# Patient Record
Sex: Female | Born: 1950 | Race: White | Hispanic: No | Marital: Married | Smoking: Never smoker
Health system: Southern US, Community
[De-identification: ages and names within clinical notes are randomized; demographics above are authoritative.]

## PROBLEM LIST (undated history)

## (undated) ENCOUNTER — Emergency Department: Discharge status: Absent without leave | Payer: Commercial Managed Care - PPO

## (undated) DIAGNOSIS — I10 Essential (primary) hypertension: Secondary | ICD-10-CM

## (undated) DIAGNOSIS — E119 Type 2 diabetes mellitus without complications: Secondary | ICD-10-CM

## (undated) HISTORY — PX: TONSILLECTOMY: SUR1361

---

## 2008-06-10 ENCOUNTER — Ambulatory Visit: Payer: Self-pay | Admitting: Family Medicine

## 2013-10-14 ENCOUNTER — Ambulatory Visit: Payer: Self-pay | Admitting: Unknown Physician Specialty

## 2013-10-26 ENCOUNTER — Emergency Department: Payer: Self-pay | Admitting: Emergency Medicine

## 2013-10-26 LAB — CBC WITH DIFFERENTIAL/PLATELET
Basophil #: 0.1 10*3/uL (ref 0.0–0.1)
Basophil %: 0.6 %
Eosinophil #: 0.1 10*3/uL (ref 0.0–0.7)
Eosinophil %: 1 %
HCT: 38.1 % (ref 35.0–47.0)
HGB: 12.7 g/dL (ref 12.0–16.0)
LYMPHS ABS: 0.8 10*3/uL — AB (ref 1.0–3.6)
Lymphocyte %: 8.5 %
MCH: 28.8 pg (ref 26.0–34.0)
MCHC: 33.3 g/dL (ref 32.0–36.0)
MCV: 87 fL (ref 80–100)
MONO ABS: 1 x10 3/mm — AB (ref 0.2–0.9)
Monocyte %: 10.8 %
NEUTROS ABS: 7.7 10*3/uL — AB (ref 1.4–6.5)
Neutrophil %: 79.1 %
PLATELETS: 185 10*3/uL (ref 150–440)
RBC: 4.4 10*6/uL (ref 3.80–5.20)
RDW: 14.2 % (ref 11.5–14.5)
WBC: 9.7 10*3/uL (ref 3.6–11.0)

## 2013-10-26 LAB — COMPREHENSIVE METABOLIC PANEL
ALK PHOS: 80 U/L
ANION GAP: 5 — AB (ref 7–16)
Albumin: 3.8 g/dL (ref 3.4–5.0)
BUN: 9 mg/dL (ref 7–18)
Bilirubin,Total: 0.3 mg/dL (ref 0.2–1.0)
CALCIUM: 9.2 mg/dL (ref 8.5–10.1)
CHLORIDE: 102 mmol/L (ref 98–107)
Co2: 26 mmol/L (ref 21–32)
Creatinine: 0.84 mg/dL (ref 0.60–1.30)
EGFR (African American): >60
EGFR (Non-African Amer.): >60
GLUCOSE: 203 mg/dL — AB (ref 65–99)
OSMOLALITY: 271 (ref 275–301)
POTASSIUM: 3.7 mmol/L (ref 3.5–5.1)
SGOT(AST): 38 U/L — ABNORMAL HIGH (ref 15–37)
SGPT (ALT): 42 U/L (ref 12–78)
SODIUM: 133 mmol/L — AB (ref 136–145)
TOTAL PROTEIN: 7.8 g/dL (ref 6.4–8.2)

## 2013-10-26 LAB — URINALYSIS, COMPLETE
BLOOD: NEGATIVE
Bilirubin,UR: NEGATIVE
Glucose,UR: NEGATIVE mg/dL (ref 0–75)
LEUKOCYTE ESTERASE: NEGATIVE
NITRITE: NEGATIVE
PH: 5 (ref 4.5–8.0)
Protein: NEGATIVE
RBC,UR: 3 /HPF (ref 0–5)
Specific Gravity: 1.03 (ref 1.003–1.030)
WBC UR: 6 /HPF (ref 0–5)

## 2013-10-26 LAB — RAPID INFLUENZA A&B ANTIGENS

## 2013-10-28 LAB — URINE CULTURE

## 2013-10-31 LAB — CULTURE, BLOOD (SINGLE)

## 2017-07-15 ENCOUNTER — Encounter: Payer: Self-pay | Admitting: Emergency Medicine

## 2017-07-15 ENCOUNTER — Emergency Department
Admission: EM | Admit: 2017-07-15 | Discharge: 2017-07-16 | Disposition: A | Discharge status: Other Acute Inpatient Hospital | Payer: Medicare Other | Attending: Emergency Medicine | Admitting: Emergency Medicine

## 2017-07-15 DIAGNOSIS — F329 Major depressive disorder, single episode, unspecified: Secondary | ICD-10-CM | POA: Insufficient documentation

## 2017-07-15 DIAGNOSIS — F419 Anxiety disorder, unspecified: Secondary | ICD-10-CM | POA: Diagnosis not present

## 2017-07-15 DIAGNOSIS — E119 Type 2 diabetes mellitus without complications: Secondary | ICD-10-CM | POA: Insufficient documentation

## 2017-07-15 DIAGNOSIS — R45851 Suicidal ideations: Secondary | ICD-10-CM | POA: Diagnosis not present

## 2017-07-15 DIAGNOSIS — F22 Delusional disorders: Secondary | ICD-10-CM | POA: Insufficient documentation

## 2017-07-15 LAB — COMPREHENSIVE METABOLIC PANEL
ALT: 17 U/L (ref 14–54)
AST: 19 U/L (ref 15–41)
Albumin: 3.9 g/dL (ref 3.5–5.0)
Alkaline Phosphatase: 64 U/L (ref 38–126)
Anion gap: 9 (ref 5–15)
BUN: 12 mg/dL (ref 6–20)
CO2: 22 mmol/L (ref 22–32)
Calcium: 9.5 mg/dL (ref 8.9–10.3)
Chloride: 107 mmol/L (ref 101–111)
Creatinine, Ser: 0.79 mg/dL (ref 0.44–1.00)
GFR calc Af Amer: >60 mL/min (ref >60)
GFR calc non Af Amer: >60 mL/min (ref >60)
Glucose, Bld: 258 mg/dL — ABNORMAL HIGH (ref 65–99)
Potassium: 3.5 mmol/L (ref 3.5–5.1)
Sodium: 138 mmol/L (ref 135–145)
Total Bilirubin: 0.6 mg/dL (ref 0.3–1.2)
Total Protein: 7.3 g/dL (ref 6.5–8.1)

## 2017-07-15 LAB — URINE DRUG SCREEN, QUALITATIVE (ARMC ONLY)
AMPHETAMINES, UR SCREEN: NOT DETECTED
BARBITURATES, UR SCREEN: NOT DETECTED
BENZODIAZEPINE, UR SCRN: NOT DETECTED
Cannabinoid 50 Ng, Ur ~~LOC~~: NOT DETECTED
Cocaine Metabolite,Ur ~~LOC~~: NOT DETECTED
MDMA (Ecstasy)Ur Screen: NOT DETECTED
Methadone Scn, Ur: NOT DETECTED
OPIATE, UR SCREEN: NOT DETECTED
PHENCYCLIDINE (PCP) UR S: NOT DETECTED
Tricyclic, Ur Screen: NOT DETECTED

## 2017-07-15 LAB — CBC
HCT: 37.4 % (ref 35.0–47.0)
Hemoglobin: 12.8 g/dL (ref 12.0–16.0)
MCH: 29.4 pg (ref 26.0–34.0)
MCHC: 34.3 g/dL (ref 32.0–36.0)
MCV: 85.7 fL (ref 80.0–100.0)
PLATELETS: 240 10*3/uL (ref 150–440)
RBC: 4.37 MIL/uL (ref 3.80–5.20)
RDW: 13.6 % (ref 11.5–14.5)
WBC: 8.1 10*3/uL (ref 3.6–11.0)

## 2017-07-15 LAB — GLUCOSE, CAPILLARY
GLUCOSE-CAPILLARY: 183 mg/dL — AB (ref 65–99)
GLUCOSE-CAPILLARY: 164 mg/dL — AB (ref 65–99)
GLUCOSE-CAPILLARY: 189 mg/dL — AB (ref 65–99)
GLUCOSE-CAPILLARY: 308 mg/dL — AB (ref 65–99)

## 2017-07-15 LAB — ACETAMINOPHEN LEVEL

## 2017-07-15 LAB — SALICYLATE LEVEL: Salicylate Lvl: <7 mg/dL (ref 2.8–30.0)

## 2017-07-15 LAB — ETHANOL: Alcohol, Ethyl (B): <5 mg/dL (ref <5)

## 2017-07-15 MED ORDER — LISINOPRIL 5 MG PO TABS
30.0000 mg | ORAL_TABLET | Freq: Every day | ORAL | Status: DC
  Administered 2017-07-15 – 2017-07-16 (×2): 30 mg via ORAL
  Filled 2017-07-15: qty 2
  Filled 2017-07-15: qty 3

## 2017-07-15 MED ORDER — INSULIN ASPART 100 UNIT/ML ~~LOC~~ SOLN
0.0000 [IU] | Freq: Three times a day (TID) | SUBCUTANEOUS | Status: DC
  Administered 2017-07-15 – 2017-07-16 (×4): 2 [IU] via SUBCUTANEOUS
  Filled 2017-07-15 (×3): qty 1

## 2017-07-15 MED ORDER — ASPIRIN EC 81 MG PO TBEC
81.0000 mg | DELAYED_RELEASE_TABLET | Freq: Every day | ORAL | Status: DC
  Administered 2017-07-15 – 2017-07-16 (×2): 81 mg via ORAL
  Filled 2017-07-15 (×2): qty 1

## 2017-07-15 MED ORDER — PANTOPRAZOLE SODIUM 40 MG PO TBEC
40.0000 mg | DELAYED_RELEASE_TABLET | Freq: Every day | ORAL | Status: DC
  Administered 2017-07-15 – 2017-07-16 (×2): 40 mg via ORAL
  Filled 2017-07-15 (×2): qty 1

## 2017-07-15 MED ORDER — INSULIN DETEMIR 100 UNIT/ML ~~LOC~~ SOLN
50.0000 [IU] | Freq: Every day | SUBCUTANEOUS | Status: DC
  Administered 2017-07-15: 50 [IU] via SUBCUTANEOUS
  Filled 2017-07-15 (×3): qty 0.5

## 2017-07-15 MED ORDER — INSULIN ASPART 100 UNIT/ML ~~LOC~~ SOLN
0.0000 [IU] | Freq: Every day | SUBCUTANEOUS | Status: DC
  Administered 2017-07-15: 4 [IU] via SUBCUTANEOUS
  Filled 2017-07-15: qty 1

## 2017-07-15 MED ORDER — OLANZAPINE 5 MG PO TBDP
2.5000 mg | ORAL_TABLET | Freq: Every day | ORAL | Status: DC
  Administered 2017-07-15: 2.5 mg via ORAL
  Filled 2017-07-15 (×2): qty 0.5

## 2017-07-15 MED ORDER — OLANZAPINE 5 MG PO TBDP
2.5000 mg | ORAL_TABLET | Freq: Once | ORAL | Status: AC
Start: 2017-07-15 — End: 2017-07-15
  Administered 2017-07-15: 2.5 mg via ORAL
  Filled 2017-07-15: qty 1

## 2017-07-15 MED ORDER — FLUOXETINE HCL 20 MG PO CAPS
20.0000 mg | ORAL_CAPSULE | Freq: Every day | ORAL | Status: DC
  Administered 2017-07-15: 20 mg via ORAL
  Filled 2017-07-15: qty 1

## 2017-07-15 MED ORDER — LORAZEPAM 1 MG PO TABS: 1.0000 mg | ORAL_TABLET | ORAL | Status: DC | PRN

## 2017-07-15 MED ORDER — FLUOXETINE HCL 20 MG PO CAPS
20.0000 mg | ORAL_CAPSULE | Freq: Once | ORAL | Status: AC
  Administered 2017-07-15: 20 mg via ORAL
  Filled 2017-07-15: qty 1

## 2017-07-15 MED ORDER — SIMVASTATIN 40 MG PO TABS
20.0000 mg | ORAL_TABLET | Freq: Every day | ORAL | Status: DC
  Administered 2017-07-15 – 2017-07-16 (×2): 20 mg via ORAL
  Filled 2017-07-15: qty 1
  Filled 2017-07-15: qty 2

## 2017-07-15 NOTE — ED Provider Notes (Signed)
[facility] Emergency Department Provider Note  ____________________________________________   First MD Initiated Contact with Patient 07/15/17 [phone]     (approximate)  I have reviewed the triage vital signs and the nursing notes.   HISTORY  Chief Complaint Allergic Reaction and Suicidal   HPI Miranda Delgado is a 66 y.o. female who is presenting with a possible adverse reaction to citalopram.  she says that she started this medication earlier today and then woke up with redness to her chest and feeling shortness of breath. Says that the symptoms have almost complete abated at this time and she is feeling very anxious and is having suicidal thoughts. She has no specific plan.    No past medical history on file.  There are no active problems to display for this patient.   No past surgical history on file.  Prior to Admission medications   Not on File    Allergies Patient has no known allergies.  No family history on file.  Social History Social History  Substance Use Topics  . Smoking status: Not on file  . Smokeless tobacco: Not on file  . Alcohol use Not on file    Review of Systems  Constitutional: No fever/chills Eyes: No visual changes. ENT: No sore throat. Cardiovascular: Denies chest pain. Respiratory: Denies shortness of breath. Gastrointestinal: No abdominal pain.  No nausea, no vomiting.  No diarrhea.  No constipation. Genitourinary: Negative for dysuria. Musculoskeletal: Negative for back pain. Skin: Negative for rash. Neurological: Negative for headaches, focal weakness or numbness.   ____________________________________________   PHYSICAL EXAM:  VITAL SIGNS: ED Triage Vitals  Enc Vitals Group     BP 07/15/17 0303 (!) 174/65     Pulse Rate 07/15/17 0303 80     Resp 07/15/17 0303 18     Temp 07/15/17 0303 97.8 F (36.6 C)     Temp Source 07/15/17 0303 Oral     SpO2 07/15/17 0303 98 %     Weight 07/15/17  0307 157 lb (71.2 kg)     Height 07/15/17 0307 5\' 3"  (1.6 m)     Head Circumference --      Peak Flow --      Pain Score --      Pain Loc --      Pain Edu? --      Excl. in [place]? --     Constitutional: Alert and oriented. Well appearing and in no acute distress. Eyes: Conjunctivae are normal.  Head: Atraumatic. Nose: No congestion/rhinnorhea. Mouth/Throat: Mucous membranes are moist.  Neck: No stridor.   Cardiovascular: Normal rate, regular rhythm. Grossly normal heart sounds.   Respiratory: Normal respiratory effort.  No retractions. Lungs CTAB. Gastrointestinal: Soft and nontender. No distention.  Musculoskeletal: No lower extremity tenderness nor edema.  No joint effusions. Neurologic:  Normal speech and language. No gross focal neurologic deficits are appreciated. Skin:  Skin is warm, dry and intact. No rash noted. Psychiatric: Mood and affect are normal. Speech and behavior are normal.  ____________________________________________   LABS (all labs ordered are listed, but only abnormal results are displayed)  Labs Reviewed  COMPREHENSIVE METABOLIC PANEL - Abnormal; Notable for the following:       Result Value   Glucose, Bld 258 (*)    All other components within normal limits  ACETAMINOPHEN LEVEL - Abnormal; Notable for the following:    Acetaminophen (Tylenol), Serum <10 (*)    All other components within normal limits  ETHANOL  SALICYLATE  LEVEL  CBC  URINE DRUG SCREEN, QUALITATIVE ([facility] ONLY)   ____________________________________________  EKG   ____________________________________________  RADIOLOGY   ____________________________________________   PROCEDURES  Procedure(s) performed:   Procedures  Critical Care performed:   ____________________________________________   INITIAL IMPRESSION / ASSESSMENT AND PLAN / ED COURSE  Pertinent labs & imaging results that were available during my care of the patient were reviewed by me and considered in my  medical decision making (see chart for details).  Patient initially laughed after she said that she was considering killing himself. However, she said "I'm serious." She will be placed under involuntary commitment. Psychiatry to see.      ____________________________________________   FINAL CLINICAL IMPRESSION(S) / ED DIAGNOSES  suicidal ideation.    NEW MEDICATIONS STARTED DURING THIS VISIT:  New Prescriptions   No medications on file     Note:  This document was prepared using Dragon voice recognition software and may include unintentional dictation errors.     Orbie Pyo, MD 07/15/17 0600

## 2017-07-15 NOTE — ED Notes (Addendum)
Referral information for Psychiatric Hospitalization faxed to;     [place] [phone]),    [address] [phone]),    [facility] [phone])   [facility] [phone]),    [place] [phone] or [phone]),    [facility] [phone]),    [place] [phone]   [facility] [phone]).

## 2017-07-15 NOTE — ED Notes (Signed)
Pt in room, took meds without difficulty, explained new medications from psychiatrist. Pt agreeable to take new meds.  Discussed some of the side effects as well. Informed patient that she will be moved to [facility] this morning.

## 2017-07-15 NOTE — ED Notes (Signed)
Pt ate dinner and has been watching some television. She has been calm/cooperative. Dr. Weber Cooks spoke with pt prior to dinner. Pt remains under IVC for placement. Will continue to monitor for needs/safety.
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
## 2017-07-15 NOTE — ED Notes (Signed)
IVC/SOC REC INPATIENT
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
## 2017-07-15 NOTE — ED Triage Notes (Signed)
Pt brought to triage via wheelchair. Pt reports she woke up this morning with a feeling of warmth going over her body. Pt reports she started a new medication today for depression. (escitalopram 10 mg). Pt deneis n/v, shortness of breath, no swelling or problems with breathing. Pt reports she feels like she is crazy. When asked if she was having any thoughts of harming herself on purpose pt states she has thoughts of not wanting to live.

## 2017-07-15 NOTE — ED Notes (Signed)
IVC 

## 2017-07-15 NOTE — ED Notes (Signed)
Report was received from Lydia Guiles., RN; Pt. Verbalizes  complaints of having paranoia; delusional; distress with stating that, she is allergic to celexa;  Verbalizes having S.I.; denies  Having Hi. Continue to monitor with 15 min. Monitoring.

## 2017-07-15 NOTE — ED Notes (Signed)
Pt family requested to be contacted by psychiatry.

## 2017-07-15 NOTE — BH Assessment (Signed)
Patient is being reviewed by [facility] for inpatient admission.

## 2017-07-15 NOTE — ED Provider Notes (Signed)
-----------------------------------------   9:13 AM on 07/15/2017 -----------------------------------------   Blood pressure (!) 174/65, pulse 80, temperature 97.8 F (36.6 C), temperature source Oral, resp. rate 18, height 5\' 3"  (1.6 m), weight 71.2 kg (157 lb), SpO2 98 %.  patient evaluated by Dr. Christophe Louis, psychiatrist and recommended inpatient admission. He recommended starting patient on Zyprexa, fluoxetine, and PRN Ativan and DC Lexapro. These medications have been ordered.   Alfred Levins, [place], MD 07/15/17 [phone]

## 2017-07-15 NOTE — ED Notes (Signed)
Patient denies any SI/HI, pt denies any hallucinations, auditory or visual.  Pt is alert and oriented. Pt states she has been up to the bathroom this morning. Pt does not want to eat breakfast at this time. Informed her that a tray had been ordered for her.  Pt has hx of DM and does not have any medications ordered for DM at this time.  Dr. Alfred Levins to reconcile medications once recommendations received from [facility].

## 2017-07-15 NOTE — ED Notes (Signed)
Pt has been polite and cooperative. She denies SI, HI, and AVH. She ate some of lunch and expressed a desire to rest.

## 2017-07-15 NOTE — ED Notes (Signed)

## 2017-07-15 NOTE — ED Notes (Signed)
Pt dressed out by Eliezer Lofts, RN who reports pt's jewelry was given to her husband to take home. Pt had bra, shirt, panties, pants and a pair of sandals that were placed in belongings bag and taken to [facility] locker room.

## 2017-07-16 LAB — GLUCOSE, CAPILLARY
GLUCOSE-CAPILLARY: 183 mg/dL — AB (ref 65–99)
GLUCOSE-CAPILLARY: 182 mg/dL — AB (ref 65–99)
Glucose-Capillary: 177 mg/dL — ABNORMAL HIGH (ref 65–99)

## 2017-07-16 NOTE — ED Notes (Signed)
Report given to Wille Glaser, Therapist, sports at [employer]

## 2017-07-16 NOTE — ED Notes (Addendum)
Brought breakfast to patient. Pt reports 'feeling better' but anxious about paranoid thoughts. Pt worried that she will continue to have thoughts when she goes home. Pt currently denies SI/HI and reports that she is less depressed.

## 2017-07-16 NOTE — BH Assessment (Signed)
Pt's IVC paperwork was faxed to [facility], as requested.

## 2017-07-16 NOTE — ED Notes (Addendum)
Patient went to bathroom then came to nurses station door and knocked. This Probation officer went to see what patient needed. Patient looked pale and confused, and started to sink to the floor.  This Probation officer went behind patient and assisted to floor and called for fellow RN for assistant.  Vitals obtained, CBG obtained. Charge ER RN called by fellow RN.

## 2017-07-16 NOTE — ED Notes (Signed)
Patient assisted to bed. Advised patient to use call light above head of bed if she needs to go to the restroom.

## 2017-07-16 NOTE — ED Notes (Signed)
Patient visiting with husband. 

## 2017-07-16 NOTE — ED Provider Notes (Signed)
-----------------------------------------   7:16 AM on 07/16/2017 -----------------------------------------   Blood pressure (!) 112/46, pulse 69, temperature 98.2 F (36.8 C), temperature source Oral, resp. rate 16, height 5\' 3"  (1.6 m), weight 71.2 kg (157 lb), SpO2 98 %.  The patient had no acute events since last update.  Calm and cooperative at this time.  Disposition is pending Psychiatry/Behavioral Medicine team recommendations.     Schuyler Amor, MD 07/16/17 [phone]

## 2017-07-16 NOTE — ED Notes (Signed)
Called to [facility] for patient having had a syncopal event. Patient is awake and able to answer questions; states she got up to use the bathroom thinking she may have to have a BM and "got real cold and real nauseated." Tapped on the window to let staff know she didn't feel well and the "next thing I know I'm down here." [place] staff states the patient did not hit the floor but rather slid the patient down her leg to the floor. MD notified and came to assess patient. No new orders received. Patient's color is pink, skin warm and dry after sitting up in the wheelchair. Will give po fluids and patient encouraged to let the staff know if she has same or similar symptoms return.

## 2017-07-16 NOTE — ED Notes (Addendum)
Patient made aware of upcoming transfer to [place] is agreeable .

## 2017-07-16 NOTE — BH Assessment (Signed)
Sherri Rad called to state pt will be accepted at [facility]. She can be reached at [phone]. She requested a copy of the IVC be faxed to her at [phone].  Physician: Dr. Hiram Gash Room: [place] Bed 2 Phone: [phone]

## 2017-07-16 NOTE — ED Notes (Signed)
Patient visiting with daughter 

## 2017-07-16 NOTE — ED Notes (Signed)
Patient transferred to [facility], under IVC, via female sheriff with all paperwork, no belongings except clothes pt is wearing.  Pt currently denies SI/HI and A/V hallucinations. Pt is stable and appreciative at time of departure.  Pt given opportunity to express concerns and ask questions.

## 2017-07-16 NOTE — ED Notes (Signed)
Lunch brought to patient 

## 2017-07-16 NOTE — Clinical Social Work Note (Signed)
CSW received phone call from [facility] [phone], they will accept patient.  CSW gave [facility] intake worker the ED phone number to speak to bedside nurse.  Trabert Broom. [place], MSW, [place]  07/16/2017 10:22 AM

## 2017-07-16 NOTE — ED Notes (Signed)
Patient awakened; up to the bathroom; came out of the bathroom knocked on the nurses station door; RN went to the door; noticed patient slumping to the floor; RN assisted patient to the floor. Patient stated that, she was feeling dizzy and cold; ED-ER Charge RN was called and made aware of what was going on with Patient. Patient's vital signs @ 0413= [phone]; ER-RN Charge arrived @ [phone] with vital signs=76-95%-102/51; ER-MD arrived @ 0424= 58-99%-118/56; Patient was placed in a chair; ER-MD arrives; and Patient's status was report to Dr. Karma Greaser; with vital signs @ 0429=55-99%-131/53. Patient spoke with Dr. Karma Greaser. Patient alert and oriented x4; fluids given per Dr. Karma Greaser. Patient placed back in bed; pillow elevation and another blanket provided; and Patient was instructed to use call bell for assistance.

## 2019-02-18 ENCOUNTER — Other Ambulatory Visit: Payer: Self-pay | Admitting: Internal Medicine

## 2019-02-18 DIAGNOSIS — Z1231 Encounter for screening mammogram for malignant neoplasm of breast: Secondary | ICD-10-CM

## 2019-12-07 ENCOUNTER — Ambulatory Visit: Payer: Medicare Other | Attending: Internal Medicine

## 2019-12-07 DIAGNOSIS — Z23 Encounter for immunization: Secondary | ICD-10-CM

## 2019-12-07 NOTE — Progress Notes (Signed)
   Covid-19 Vaccination Clinic  Name:  ROCHELE PHILSON    MRN: [id number] DOB: October 09, 1951  12/07/2019  Ms. Ziebell was observed post Covid-19 immunization for 15 minutes without incidence. She was provided with Vaccine Information Sheet and instruction to access the V-Safe system.   Ms. Fregoso was instructed to call 911 with any severe reactions post vaccine: Marland Kitchen Difficulty breathing  . Swelling of your face and throat  . A fast heartbeat  . A bad rash all over your body  . Dizziness and weakness    Immunizations Administered    Name Date Dose VIS Date Route   Pfizer COVID-19 Vaccine 12/07/2019 10:41 AM 0.3 mL 10/04/2019 Intramuscular   Manufacturer: [place]   Lot: [id number]   [place]: [id number]

## 2019-12-28 ENCOUNTER — Ambulatory Visit: Payer: Medicare Other | Attending: Internal Medicine

## 2019-12-28 DIAGNOSIS — Z23 Encounter for immunization: Secondary | ICD-10-CM | POA: Insufficient documentation

## 2019-12-28 NOTE — Progress Notes (Signed)
   Covid-19 Vaccination Clinic  Name:  Miranda Delgado    MRN: [id number] DOB: 1950/11/21  12/28/2019  Miranda Delgado was observed post Covid-19 immunization for 15 minutes without incident. She was provided with Vaccine Information Sheet and instruction to access the V-Safe system.   Miranda Delgado was instructed to call 911 with any severe reactions post vaccine: Marland Kitchen Difficulty breathing  . Swelling of face and throat  . A fast heartbeat  . A bad rash all over body  . Dizziness and weakness   Immunizations Administered    Name Date Dose VIS Date Route   Pfizer COVID-19 Vaccine 12/28/2019 11:01 AM 0.3 mL 10/04/2019 Intramuscular   Manufacturer: [place]   Lot: [id number]   [place]: [id number]

## 2020-03-25 ENCOUNTER — Other Ambulatory Visit: Payer: Self-pay | Admitting: Internal Medicine

## 2020-03-25 DIAGNOSIS — Z1231 Encounter for screening mammogram for malignant neoplasm of breast: Secondary | ICD-10-CM

## 2020-03-30 ENCOUNTER — Other Ambulatory Visit: Payer: Self-pay | Admitting: Internal Medicine

## 2020-03-30 DIAGNOSIS — N6310 Unspecified lump in the right breast, unspecified quadrant: Secondary | ICD-10-CM

## 2020-03-30 DIAGNOSIS — R928 Other abnormal and inconclusive findings on diagnostic imaging of breast: Secondary | ICD-10-CM

## 2020-04-02 ENCOUNTER — Inpatient Hospital Stay
Admission: RE | Admit: 2020-04-02 | Discharge: 2020-04-02 | Disposition: A | Discharge status: Home / Self Care | Payer: Self-pay | Source: Ambulatory Visit | Attending: *Deleted | Admitting: *Deleted

## 2020-04-02 ENCOUNTER — Other Ambulatory Visit: Payer: Self-pay | Admitting: *Deleted

## 2020-04-02 DIAGNOSIS — Z1231 Encounter for screening mammogram for malignant neoplasm of breast: Secondary | ICD-10-CM

## 2020-04-08 ENCOUNTER — Ambulatory Visit
Admission: RE | Admit: 2020-04-08 | Discharge: 2020-04-08 | Disposition: A | Discharge status: Home / Self Care | Payer: Medicare Other | Source: Ambulatory Visit | Attending: Internal Medicine | Admitting: Internal Medicine

## 2020-04-08 DIAGNOSIS — N6310 Unspecified lump in the right breast, unspecified quadrant: Secondary | ICD-10-CM | POA: Insufficient documentation

## 2020-04-08 DIAGNOSIS — R928 Other abnormal and inconclusive findings on diagnostic imaging of breast: Secondary | ICD-10-CM

## 2021-04-27 ENCOUNTER — Other Ambulatory Visit: Payer: Self-pay | Admitting: Internal Medicine

## 2021-04-27 DIAGNOSIS — Z1231 Encounter for screening mammogram for malignant neoplasm of breast: Secondary | ICD-10-CM

## 2021-05-10 ENCOUNTER — Other Ambulatory Visit: Payer: Self-pay

## 2021-05-10 ENCOUNTER — Ambulatory Visit
Admission: RE | Admit: 2021-05-10 | Discharge: 2021-05-10 | Disposition: A | Discharge status: Home / Self Care | Payer: Medicare Other | Source: Ambulatory Visit | Attending: Internal Medicine | Admitting: Internal Medicine

## 2021-05-10 DIAGNOSIS — Z1231 Encounter for screening mammogram for malignant neoplasm of breast: Secondary | ICD-10-CM | POA: Diagnosis not present

## 2021-07-16 ENCOUNTER — Emergency Department
Admission: EM | Admit: 2021-07-16 | Discharge: 2021-07-16 | Disposition: A | Discharge status: Home / Self Care | Payer: Medicare Other | Attending: Emergency Medicine | Admitting: Emergency Medicine

## 2021-07-16 ENCOUNTER — Other Ambulatory Visit: Payer: Self-pay

## 2021-07-16 ENCOUNTER — Emergency Department: Payer: Medicare Other

## 2021-07-16 DIAGNOSIS — E119 Type 2 diabetes mellitus without complications: Secondary | ICD-10-CM | POA: Diagnosis not present

## 2021-07-16 DIAGNOSIS — Z7982 Long term (current) use of aspirin: Secondary | ICD-10-CM | POA: Diagnosis not present

## 2021-07-16 DIAGNOSIS — R002 Palpitations: Secondary | ICD-10-CM | POA: Insufficient documentation

## 2021-07-16 DIAGNOSIS — Z79899 Other long term (current) drug therapy: Secondary | ICD-10-CM | POA: Insufficient documentation

## 2021-07-16 DIAGNOSIS — Z794 Long term (current) use of insulin: Secondary | ICD-10-CM | POA: Diagnosis not present

## 2021-07-16 DIAGNOSIS — I10 Essential (primary) hypertension: Secondary | ICD-10-CM | POA: Diagnosis not present

## 2021-07-16 HISTORY — DX: Type 2 diabetes mellitus without complications: E11.9

## 2021-07-16 HISTORY — DX: Essential (primary) hypertension: I10

## 2021-07-16 LAB — CBC
HCT: 33.5 % — ABNORMAL LOW (ref 36.0–46.0)
Hemoglobin: 11.5 g/dL — ABNORMAL LOW (ref 12.0–15.0)
MCH: 30.5 pg (ref 26.0–34.0)
MCHC: 34.3 g/dL (ref 30.0–36.0)
MCV: 88.9 fL (ref 80.0–100.0)
Platelets: 213 10*3/uL (ref 150–400)
RBC: 3.77 MIL/uL — ABNORMAL LOW (ref 3.87–5.11)
RDW: 12.6 % (ref 11.5–15.5)
WBC: 6.8 10*3/uL (ref 4.0–10.5)
nRBC: 0 % (ref 0.0–0.2)

## 2021-07-16 LAB — BASIC METABOLIC PANEL
Anion gap: 9 (ref 5–15)
BUN: 17 mg/dL (ref 8–23)
CO2: 21 mmol/L — ABNORMAL LOW (ref 22–32)
Calcium: 9.3 mg/dL (ref 8.9–10.3)
Chloride: 102 mmol/L (ref 98–111)
Creatinine, Ser: 0.95 mg/dL (ref 0.44–1.00)
GFR, Estimated: >60 mL/min (ref >60)
Glucose, Bld: 237 mg/dL — ABNORMAL HIGH (ref 70–99)
Potassium: 4.6 mmol/L (ref 3.5–5.1)
Sodium: 132 mmol/L — ABNORMAL LOW (ref 135–145)

## 2021-07-16 LAB — TROPONIN I (HIGH SENSITIVITY): Troponin I (High Sensitivity): 8 ng/L (ref <18)

## 2021-07-16 NOTE — ED Notes (Signed)
Pt wheeled to lobby and husband transported patient home. No distress at this time. All questions answered. Will follow up with cardiology.

## 2021-07-16 NOTE — ED Triage Notes (Signed)
Pt comes into the ED via [facility] from home c/o tachycardia and HTN.  Pt's HR is between 102-104 but is hypertensive at 190/96.  Pt denies any CP or SHOB, but just feels like her heart is palpating.  Denies any h/o a-fib.  12 lead is NS with 1st degree HB.  H/o DB, CBG 222.

## 2021-07-16 NOTE — ED Triage Notes (Signed)
See first note for more info, pt reports she was sitting on her couch and suddenly felt her heart racing, denies pain, states she is feeling better now

## 2021-07-16 NOTE — ED Provider Notes (Signed)
[facility] Emergency Department Provider Note   ____________________________________________    I have reviewed the triage vital signs and the nursing notes.   HISTORY  Chief Complaint Palpitations     HPI Miranda Delgado is a 70 y.o. female with history of diabetes, hypertension who presents after episode of palpitations.  Patient reports she was sitting down when she felt her heart racing, she reports this lasted approximately 10 to 15 minutes.  She denies chest pain.  No shortness of breath.  No pleurisy.  Now feels back to baseline.  She reports this is happened in the past, and she has worn a Holter monitor many years ago.  She does see Dr. Ubaldo Glassing of cardiology.  No fevers or chills or cough.  No new medications.  Past Medical History:  Diagnosis Date   Diabetes mellitus without complication ([place])    Hypertension     There are no problems to display for this patient.   Past Surgical History:  Procedure Laterality Date   CESAREAN SECTION     TONSILLECTOMY      Prior to Admission medications   Medication Sig Start Date End Date Taking? Authorizing Provider  aspirin EC 81 MG tablet Take 81 mg by mouth daily.    [provider]  HUMALOG KWIKPEN 100 UNIT/ML KiwkPen Inject 10 Units into the skin daily. Take with largest meal of the day. 07/12/17   [provider]  LEVEMIR FLEXTOUCH 100 UNIT/ML Pen Inject 50 Units into the skin at bedtime. 07/12/17   [provider]  lisinopril (PRINIVIL,ZESTRIL) 30 MG tablet Take 30 mg by mouth daily. 07/12/17   [provider]  omeprazole (PRILOSEC) 20 MG capsule Take 20 mg by mouth daily. 07/12/17   [provider]  simvastatin (ZOCOR) 20 MG tablet Take 20 mg by mouth daily. 07/12/17   [provider]     Allergies Patient has no known allergies.  No family history on file.  Social History Social History   Tobacco Use   Smoking status: Never    Smokeless tobacco: Never  Substance Use Topics   Alcohol use: Not Currently   Drug use: Not Currently    Review of Systems  Constitutional: No fever/chills Eyes: No visual changes.  ENT: No sore throat. Cardiovascular: As above Respiratory: Denies shortness of breath. Gastrointestinal: No abdominal pain.  No nausea, no vomiting.   Genitourinary: Negative for dysuria. Musculoskeletal: Negative for back pain. Skin: Negative for rash. Neurological: Negative for headaches or weakness   ____________________________________________   PHYSICAL EXAM:  VITAL SIGNS: ED Triage Vitals  Enc Vitals Group     BP 07/16/21 1241 (!) 169/72     Pulse Rate 07/16/21 1241 99     Resp 07/16/21 1241 18     Temp 07/16/21 1246 98.9 F (37.2 C)     Temp Source 07/16/21 1241 Oral     SpO2 07/16/21 1241 99 %     Weight 07/16/21 1241 64.4 kg (142 lb)     Height 07/16/21 1241 1.6 m (5\' 3" )     Head Circumference --      Peak Flow --      Pain Score 07/16/21 1241 0     Pain Loc --      Pain Edu? --      Excl. in [place]? --     Constitutional: Alert and oriented. No acute distress. Pleasant and interactive  Mouth/Throat: Mucous membranes are moist.   Neck:  Painless ROM Cardiovascular: Normal rate, regular rhythm.  3 out of 6 systolic ejection murmur, likely aortic stenosis, she reports this is chronic.  Good peripheral circulation. Respiratory: Normal respiratory effort.  No retractions. Lungs CTAB. Gastrointestinal: Soft and nontender. No distention.  No CVA tenderness.  Musculoskeletal: No lower extremity tenderness nor edema.  Warm and well perfused Neurologic:  Normal speech and language. No gross focal neurologic deficits are appreciated.  Skin:  Skin is warm, dry and intact. No rash noted. Psychiatric: Mood and affect are normal. Speech and behavior are normal.  ____________________________________________   LABS (all labs ordered are listed, but only abnormal results are  displayed)  Labs Reviewed  BASIC METABOLIC PANEL - Abnormal; Notable for the following components:      Result Value   Sodium 132 (*)    CO2 21 (*)    Glucose, Bld 237 (*)    All other components within normal limits  CBC - Abnormal; Notable for the following components:   RBC 3.77 (*)    Hemoglobin 11.5 (*)    HCT 33.5 (*)    All other components within normal limits  TROPONIN I (HIGH SENSITIVITY)   ____________________________________________  EKG  ED ECG REPORT I, Lavonia Drafts, the attending physician, personally viewed and interpreted this ECG.  Date: 07/16/2021  Rhythm: normal sinus rhythm QRS Axis: normal Intervals: normal ST/T Wave abnormalities: normal Narrative Interpretation: no evidence of acute ischemia  ____________________________________________  RADIOLOGY  Chest x-ray viewed by me, no infiltrate or effusion ____________________________________________   PROCEDURES  Procedure(s) performed: No  .1-3 Lead EKG Interpretation Performed by: Lavonia Drafts, MD Authorized by: Lavonia Drafts, MD     Interpretation: normal     ECG rate assessment: normal     Rhythm: sinus rhythm     Ectopy: none     Conduction: normal     Critical Care performed: No ____________________________________________   INITIAL IMPRESSION / ASSESSMENT AND PLAN / ED COURSE  Pertinent labs & imaging results that were available during my care of the patient were reviewed by me and considered in my medical decision making (see chart for details).   Patient well-appearing and in no acute distress, asymptomatic here in the emergency department.  EKG is reassuring.  Placed on the cardiac monitor, labs pending.  Likely arrhythmia, unclear cause, it sounds like this is been happening to her for some time.  Will observe on the cardiac monitor, check labs including troponin, chest x-ray  ----------------------------------------- 2:48 PM on  07/16/2021 -----------------------------------------  Lab work is quite reassuring.  Discussed this with patient and family.  Appropriate for outpatient follow-up with Dr. Ubaldo Glassing, possible Holter monitoring.  She knows to return if symptoms return    ____________________________________________   FINAL CLINICAL IMPRESSION(S) / ED DIAGNOSES  Final diagnoses:  Palpitations        Note:  This document was prepared using Dragon voice recognition software and may include unintentional dictation errors.    Lavonia Drafts, MD 07/16/21 [phone]

## 2022-09-14 ENCOUNTER — Encounter: Payer: Self-pay | Admitting: Gastroenterology

## 2022-09-18 NOTE — H&P (Signed)
Pre-Procedure H&P   Patient ID: Miranda Delgado is a 71 y.o. female.  Gastroenterology Provider: Annamaria Helling, DO  Referring Provider: Dawson Bills, NP PCP: Tracie Harrier, MD  Date: 09/19/2022  HPI Ms. Miranda Delgado is a 71 y.o. female who presents today for Colonoscopy for Screening colonoscopy .  Last underwent colonoscopy in January 2006 with normal TI and descending colon diverticulosis.  Internal hemorrhoids were also noted.  1 hyperplastic polyp removed  Pt on ozempic which has been held for this procedure (last dose 09/11/22). Has experienced more constipation since starting ozempic- one bowel movement per week. No melena/hematochezia  Most recent lab work hemoglobin 10.9 MCV 89 platelets 256,000 A1c 6.6 creatinine 0.9  Maternal grandmother with history of gastric cancer   Past Medical History:  Diagnosis Date   Diabetes mellitus without complication ([place])    Hypertension     Past Surgical History:  Procedure Laterality Date   CESAREAN SECTION     TONSILLECTOMY      Family History Maternal grandmother with history of gastric cancer No other h/o GI disease or malignancy  Review of Systems  Constitutional:  Negative for activity change, appetite change, chills, diaphoresis, fatigue, fever and unexpected weight change.  HENT:  Negative for trouble swallowing and voice change.   Respiratory:  Negative for shortness of breath and wheezing.   Cardiovascular:  Negative for chest pain, palpitations and leg swelling.  Gastrointestinal:  Positive for constipation. Negative for abdominal distention, abdominal pain, anal bleeding, blood in stool, diarrhea, nausea, rectal pain and vomiting.  Musculoskeletal:  Negative for arthralgias and myalgias.  Skin:  Negative for color change and pallor.  Neurological:  Negative for dizziness, syncope and weakness.  Psychiatric/Behavioral:  Negative for confusion.   All other systems reviewed and are negative.     Medications No current facility-administered medications on file prior to encounter.   Current Outpatient Medications on File Prior to Encounter  Medication Sig Dispense Refill   amLODipine (NORVASC) 10 MG tablet Take 10 mg by mouth daily.     lisinopril (PRINIVIL,ZESTRIL) 30 MG tablet Take 30 mg by mouth daily.     SEMAGLUTIDE,0.25 OR 0.'5MG'$ /DOS, [place] Inject into the skin.     simvastatin (ZOCOR) 20 MG tablet Take 20 mg by mouth daily.     aspirin EC 81 MG tablet Take 81 mg by mouth daily.     HUMALOG KWIKPEN 100 UNIT/ML KiwkPen Inject 10 Units into the skin daily. Take with largest meal of the day. (Patient not taking: Reported on 09/19/2022)     LEVEMIR FLEXTOUCH 100 UNIT/ML Pen Inject 50 Units into the skin at bedtime. (Patient not taking: Reported on 09/19/2022)     omeprazole (PRILOSEC) 20 MG capsule Take 20 mg by mouth daily. (Patient not taking: Reported on 09/19/2022)      Pertinent medications related to GI and procedure were reviewed by me with the patient prior to the procedure   Current Facility-Administered Medications:    0.9 %  sodium chloride infusion, , Intravenous, Continuous, Annamaria Helling, DO, Last Rate: 20 mL/hr at 09/19/22 0932, New Bag at 09/19/22 0932      No Known Allergies Allergies were reviewed by me prior to the procedure  Objective   Body mass index is 23.91 kg/m. Vitals:   09/19/22 0930  BP: (!) 152/64  Pulse: 95  Resp: 18  Temp: (!) 97.3 F (36.3 C)  TempSrc: Temporal  SpO2: 100%  Weight: 61.2 kg  Height: '5\' 3"'$  (  1.6 m)     Physical Exam Vitals and nursing note reviewed.  Constitutional:      General: She is not in acute distress.    Appearance: Normal appearance. She is not ill-appearing, toxic-appearing or diaphoretic.  HENT:     Head: Normocephalic and atraumatic.     Nose: Nose normal.     Mouth/Throat:     Mouth: Mucous membranes are moist.     Pharynx: Oropharynx is clear.  Eyes:     General: No scleral icterus.     Extraocular Movements: Extraocular movements intact.  Cardiovascular:     Rate and Rhythm: Regular rhythm. Tachycardia present.     Heart sounds: Murmur heard.     No friction rub. No gallop.  Pulmonary:     Effort: Pulmonary effort is normal. No respiratory distress.     Breath sounds: Normal breath sounds. No wheezing, rhonchi or rales.  Abdominal:     General: Abdomen is flat. Bowel sounds are normal. There is no distension.     Palpations: Abdomen is soft.     Tenderness: There is no abdominal tenderness. There is no guarding or rebound.  Musculoskeletal:     Cervical back: Neck supple.     Right lower leg: No edema.     Left lower leg: No edema.  Skin:    General: Skin is warm and dry.     Coloration: Skin is not jaundiced or pale.  Neurological:     General: No focal deficit present.     Mental Status: She is alert and oriented to person, place, and time. Mental status is at baseline.  Psychiatric:        Mood and Affect: Mood normal.        Behavior: Behavior normal.        Thought Content: Thought content normal.        Judgment: Judgment normal.      Assessment:  Miranda Delgado is a 71 y.o. female  who presents today for Colonoscopy for screening colonoscopy.  Plan:  Colonoscopy with possible intervention today  Colonoscopy with possible biopsy, control of bleeding, polypectomy, and interventions as necessary has been discussed with the patient/patient representative. Informed consent was obtained from the patient/patient representative after explaining the indication, nature, and risks of the procedure including but not limited to death, bleeding, perforation, missed neoplasm/lesions, cardiorespiratory compromise, and reaction to medications. Opportunity for questions was given and appropriate answers were provided. Patient/patient representative has verbalized understanding is amenable to undergoing the procedure.   Annamaria Helling, DO  [facility]  Gastroenterology  Portions of the record may have been created with voice recognition software. Occasional wrong-word or 'sound-a-like' substitutions may have occurred due to the inherent limitations of voice recognition software.  Read the chart carefully and recognize, using context, where substitutions may have occurred.

## 2022-09-19 ENCOUNTER — Encounter: Payer: Self-pay | Admitting: Gastroenterology

## 2022-09-19 ENCOUNTER — Encounter
Admission: RE | Disposition: A | Discharge status: Home / Self Care | Payer: Self-pay | Source: Ambulatory Visit | Attending: Gastroenterology

## 2022-09-19 ENCOUNTER — Ambulatory Visit: Payer: Medicare Other | Admitting: Anesthesiology

## 2022-09-19 ENCOUNTER — Other Ambulatory Visit: Payer: Self-pay

## 2022-09-19 ENCOUNTER — Ambulatory Visit
Admission: RE | Admit: 2022-09-19 | Discharge: 2022-09-19 | Disposition: A | Discharge status: Home / Self Care | Payer: Medicare Other | Source: Ambulatory Visit | Attending: Gastroenterology | Admitting: Gastroenterology

## 2022-09-19 DIAGNOSIS — K219 Gastro-esophageal reflux disease without esophagitis: Secondary | ICD-10-CM | POA: Diagnosis not present

## 2022-09-19 DIAGNOSIS — D122 Benign neoplasm of ascending colon: Secondary | ICD-10-CM | POA: Insufficient documentation

## 2022-09-19 DIAGNOSIS — E119 Type 2 diabetes mellitus without complications: Secondary | ICD-10-CM | POA: Insufficient documentation

## 2022-09-19 DIAGNOSIS — Z1211 Encounter for screening for malignant neoplasm of colon: Secondary | ICD-10-CM | POA: Diagnosis present

## 2022-09-19 DIAGNOSIS — Z7985 Long-term (current) use of injectable non-insulin antidiabetic drugs: Secondary | ICD-10-CM | POA: Insufficient documentation

## 2022-09-19 DIAGNOSIS — D123 Benign neoplasm of transverse colon: Secondary | ICD-10-CM | POA: Diagnosis not present

## 2022-09-19 DIAGNOSIS — K5730 Diverticulosis of large intestine without perforation or abscess without bleeding: Secondary | ICD-10-CM | POA: Diagnosis not present

## 2022-09-19 DIAGNOSIS — Z794 Long term (current) use of insulin: Secondary | ICD-10-CM | POA: Diagnosis not present

## 2022-09-19 DIAGNOSIS — K640 First degree hemorrhoids: Secondary | ICD-10-CM | POA: Diagnosis not present

## 2022-09-19 DIAGNOSIS — Z79899 Other long term (current) drug therapy: Secondary | ICD-10-CM | POA: Insufficient documentation

## 2022-09-19 DIAGNOSIS — I10 Essential (primary) hypertension: Secondary | ICD-10-CM | POA: Insufficient documentation

## 2022-09-19 HISTORY — PX: COLONOSCOPY WITH PROPOFOL: SHX5780

## 2022-09-19 LAB — GLUCOSE, CAPILLARY: Glucose-Capillary: 138 mg/dL — ABNORMAL HIGH (ref 70–99)

## 2022-09-19 SURGERY — COLONOSCOPY WITH PROPOFOL
Anesthesia: General

## 2022-09-19 MED ORDER — LIDOCAINE 2% (20 MG/ML) 5 ML SYRINGE
INTRAMUSCULAR | Status: DC | PRN
  Administered 2022-09-19: 20 mg via INTRAVENOUS

## 2022-09-19 MED ORDER — STERILE WATER FOR IRRIGATION IR SOLN
Status: DC | PRN
  Administered 2022-09-19: 60 mL

## 2022-09-19 MED ORDER — SODIUM CHLORIDE 0.9 % IV SOLN: INTRAVENOUS | Status: DC

## 2022-09-19 MED ORDER — PROPOFOL 500 MG/50ML IV EMUL
INTRAVENOUS | Status: DC | PRN
  Administered 2022-09-19: 120 ug/kg/min via INTRAVENOUS

## 2022-09-19 MED ORDER — PROPOFOL 10 MG/ML IV BOLUS
INTRAVENOUS | Status: DC | PRN
  Administered 2022-09-19: 70 mg via INTRAVENOUS
  Administered 2022-09-19: 30 mg via INTRAVENOUS

## 2022-09-19 NOTE — Anesthesia Postprocedure Evaluation (Signed)
Anesthesia Post Note  Patient: Miranda Delgado  Procedure(s) Performed: COLONOSCOPY WITH PROPOFOL  Patient location during evaluation: Endoscopy Anesthesia Type: General Level of consciousness: awake and alert Pain management: pain level controlled Vital Signs Assessment: post-procedure vital signs reviewed and stable Respiratory status: spontaneous breathing, nonlabored ventilation, respiratory function stable and patient connected to nasal cannula oxygen Cardiovascular status: blood pressure returned to baseline and stable Postop Assessment: no apparent nausea or vomiting Anesthetic complications: no   No notable events documented.   Last Vitals:  Vitals:   09/19/22 1038 09/19/22 1043  BP: (!) 106/52 (!) 120/57  Pulse:    Resp:    Temp:    SpO2:      Last Pain:  Vitals:   09/19/22 1029  TempSrc: Temporal  PainSc: 0-No pain                 Precious Haws Raniyah Curenton

## 2022-09-19 NOTE — Op Note (Signed)
[facility] Gastroenter[facility] Patient Name: Miranda Delgado Procedure Date: 09/19/2022 9:42 AM MRN: [id number] Account #: [id number] Date of Birth: [birth year] Admit Type: Outpatient Age: 71 Room: [facility] ENDO ROOM 2 Gender: Female Note Status: Finalized Instrument Name: Colonoscope [id number] Procedure:             Colonoscopy Indications:           Screening for colorectal malignant neoplasm Providers:             Annamaria Helling DO, DO Referring MD:          No Local Md, MD (Referring MD) Medicines:             Monitored Anesthesia Care Complications:         No immediate complications. Estimated blood loss:                         Minimal. Procedure:             Pre-Anesthesia Assessment:                        - Prior to the procedure, a History and Physical was                         performed, and patient medications and allergies were                         reviewed. The patient is competent. The risks and                         benefits of the procedure and the sedation options and                         risks were discussed with the patient. All questions                         were answered and informed consent was obtained.                         Patient identification and proposed procedure were                         verified by the physician, the nurse, the anesthetist                         and the technician in the endoscopy suite. Mental                         Status Examination: alert and oriented. Airway                         Examination: normal oropharyngeal airway and neck                         mobility. Respiratory Examination: clear to                         auscultation. CV Examination: regular rate and rhythm  and systolic murmur. Prophylactic Antibiotics: The                         patient does not require prophylactic antibiotics.                         Prior Anticoagulants: The patient has taken no                          anticoagulant or antiplatelet agents. ASA Grade                         Assessment: III - A patient with severe systemic                         disease. After reviewing the risks and benefits, the                         patient was deemed in satisfactory condition to                         undergo the procedure. The anesthesia plan was to use                         monitored anesthesia care (MAC). Immediately prior to                         administration of medications, the patient was                         re-assessed for adequacy to receive sedatives. The                         heart rate, respiratory rate, oxygen saturations,                         blood pressure, adequacy of pulmonary ventilation, and                         response to care were monitored throughout the                         procedure. The physical status of the patient was                         re-assessed after the procedure.                        After obtaining informed consent, the colonoscope was                         passed under direct vision. Throughout the procedure,                         the patient's blood pressure, pulse, and oxygen                         saturations were monitored continuously. The  Colonoscope was introduced through the anus and                         advanced to the the terminal ileum, with                         identification of the appendiceal orifice and IC                         valve. The colonoscopy was performed without                         difficulty. The patient tolerated the procedure well.                         The quality of the bowel preparation was evaluated                         using the BBPS [facility] Bowel Preparation Scale) with                         scores of: Right Colon = 3, Transverse Colon = 3 and                         Left Colon = 3 (entire mucosa seen well with no                          residual staining, small fragments of stool or opaque                         liquid). The total BBPS score equals 9. The terminal                         ileum, ileocecal valve, appendiceal orifice, and                         rectum were photographed. Findings:      The perianal and digital rectal examinations were normal. Pertinent       negatives include normal sphincter tone.      The terminal ileum appeared normal. Estimated blood loss: none.      Scattered small-mouthed diverticula were found in the left colon.       Estimated blood loss: none.      Non-bleeding internal hemorrhoids were found during retroflexion. The       hemorrhoids were Grade I (internal hemorrhoids that do not prolapse).       Estimated blood loss: none.      A 2 to 3 mm polyp was found in the transverse colon. The polyp was       sessile. The polyp was removed with a cold snare. Resection and       retrieval were complete. Estimated blood loss was minimal.      A 1 mm polyp was found in the ascending colon. The polyp was sessile.       The polyp was removed with a jumbo cold forceps. Resection and retrieval       were complete. Estimated blood loss was minimal.      The exam was otherwise without  abnormality on direct and retroflexion       views. Impression:            - The examined portion of the ileum was normal.                        - Diverticulosis in the left colon.                        - Non-bleeding internal hemorrhoids.                        - One 2 to 3 mm polyp in the transverse colon, removed                         with a cold snare. Resected and retrieved.                        - One 1 mm polyp in the ascending colon, removed with                         a jumbo cold forceps. Resected and retrieved.                        - The examination was otherwise normal on direct and                         retroflexion views. Recommendation:        - Patient has a contact number available  for                         emergencies. The signs and symptoms of potential                         delayed complications were discussed with the patient.                         Return to normal activities tomorrow. Written                         discharge instructions were provided to the patient.                        - Discharge patient to home.                        - Resume previous diet.                        - Continue present medications.                        - Await pathology results.                        - Repeat colonoscopy for surveillance based on                         pathology results.                        -  Return to referring physician as previously                         scheduled.                        - Recommend initiating one capful of miralax daily                         while dealing with constipation relating to ozempic.                         Ensure adequate water intake.                        - The findings and recommendations were discussed with                         the patient. Procedure Code(s):     --- Professional ---                        [phone], Colonoscopy, flexible; with removal of                         tumor(s), polyp(s), or other lesion(s) by snare                         technique                        45380, 42, Colonoscopy, flexible; with biopsy, single                         or multiple Diagnosis Code(s):     --- Professional ---                        Z12.11, Encounter for screening for malignant neoplasm                         of colon                        K64.0, First degree hemorrhoids                        D12.3, Benign neoplasm of transverse colon (hepatic                         flexure or splenic flexure)                        D12.2, Benign neoplasm of ascending colon                        K57.30, Diverticulosis of large intestine without                         perforation or abscess without  bleeding CPT copyright 2022 American Medical Association. All rights reserved. The codes documented in this report are preliminary and upon coder review may  be revised to meet current compliance requirements. Attending Participation:  I personally performed the entire procedure. Volney American, DO Annamaria Helling DO, DO 09/19/2022 10:20:17 AM This report has been signed electronically. Number of Addenda: 0 Note Initiated On: 09/19/2022 9:42 AM Scope Withdrawal Time: 0 hours 18 minutes 9 seconds  Total Procedure Duration: 0 hours 28 minutes 44 seconds  Estimated Blood Loss:  Estimated blood loss was minimal.      [facility]

## 2022-09-19 NOTE — Interval H&P Note (Signed)
History and Physical Interval Note: Preprocedure H&P from 09/19/22  was reviewed and there was no interval change after seeing and examining the patient.  Written consent was obtained from the patient after discussion of risks, benefits, and alternatives. Patient has consented to proceed with Colonoscopy with possible intervention   09/19/2022 9:37 AM  Miranda Delgado  has presented today for surgery, with the diagnosis of Z12.11 - Colon cancer screening.  The various methods of treatment have been discussed with the patient and family. After consideration of risks, benefits and other options for treatment, the patient has consented to  Procedure(s): COLONOSCOPY WITH PROPOFOL (N/A) as a surgical intervention.  The patient's history has been reviewed, patient examined, no change in status, stable for surgery.  I have reviewed the patient's chart and labs.  Questions were answered to the patient's satisfaction.     Annamaria Helling

## 2022-09-19 NOTE — Anesthesia Preprocedure Evaluation (Signed)
Anesthesia Evaluation  Patient identified by MRN, date of birth, ID band Patient awake    Reviewed: Allergy & Precautions, NPO status , Patient's Chart, lab work & pertinent test results  History of Anesthesia Complications Negative for: history of anesthetic complications  Airway Mallampati: III  TM Distance: <3 FB Neck ROM: full    Dental  (+) Chipped   Pulmonary neg pulmonary ROS, neg shortness of breath   Pulmonary exam normal        Cardiovascular Exercise Tolerance: Good hypertension, (-) angina Normal cardiovascular exam     Neuro/Psych negative neurological ROS  negative psych ROS   GI/Hepatic negative GI ROS, Neg liver ROS,neg GERD  ,,  Endo/Other  diabetes, Type 2    Renal/GU negative Renal ROS  negative genitourinary   Musculoskeletal   Abdominal   Peds  Hematology negative hematology ROS (+)   Anesthesia Other Findings Past Medical History: No date: Diabetes mellitus without complication (HCC) No date: Hypertension  Past Surgical History: No date: CESAREAN SECTION No date: TONSILLECTOMY     Reproductive/Obstetrics negative OB ROS                             Anesthesia Physical Anesthesia Plan  ASA: 3  Anesthesia Plan: General   Post-op Pain Management:    Induction: Intravenous  PONV Risk Score and Plan: Propofol infusion and TIVA  Airway Management Planned: Natural Airway and Nasal Cannula  Additional Equipment:   Intra-op Plan:   Post-operative Plan:   Informed Consent: I have reviewed the patients History and Physical, chart, labs and discussed the procedure including the risks, benefits and alternatives for the proposed anesthesia with the patient or authorized representative who has indicated his/her understanding and acceptance.     Dental Advisory Given  Plan Discussed with: Anesthesiologist, CRNA and Surgeon  Anesthesia Plan Comments:  (Patient consented for risks of anesthesia including but not limited to:  - adverse reactions to medications - risk of airway placement if required - damage to eyes, teeth, lips or other oral mucosa - nerve damage due to positioning  - sore throat or hoarseness - Damage to heart, brain, nerves, lungs, other parts of body or loss of life  Patient voiced understanding.)       Anesthesia Quick Evaluation

## 2022-09-19 NOTE — Transfer of Care (Signed)
Immediate Anesthesia Transfer of Care Note  Patient: Miranda Delgado  Procedure(s) Performed: COLONOSCOPY WITH PROPOFOL  Patient Location: Endoscopy Unit  Anesthesia Type:General  Level of Consciousness: awake  Airway & Oxygen Therapy: Patient Spontanous Breathing  Post-op Assessment: Report given to RN and Post -op Vital signs reviewed and stable  Post vital signs: Reviewed  Last Vitals:  Vitals Value Taken Time  BP 105/63   Temp    Pulse 75 09/19/22 1019  Resp 12 09/19/22 1020  SpO2 100 % 09/19/22 1019  Vitals shown include unvalidated device data.  Last Pain:  Vitals:   09/19/22 0930  TempSrc: Temporal  PainSc: 0-No pain         Complications: No notable events documented.

## 2022-09-20 ENCOUNTER — Encounter: Payer: Self-pay | Admitting: Gastroenterology

## 2022-09-20 LAB — SURGICAL PATHOLOGY

## 2023-03-22 ENCOUNTER — Emergency Department
Admission: EM | Admit: 2023-03-22 | Discharge: 2023-03-22 | Disposition: A | Discharge status: Home / Self Care | Payer: Medicare Other | Attending: Emergency Medicine | Admitting: Emergency Medicine

## 2023-03-22 ENCOUNTER — Emergency Department: Payer: Medicare Other

## 2023-03-22 DIAGNOSIS — R002 Palpitations: Secondary | ICD-10-CM | POA: Diagnosis not present

## 2023-03-22 DIAGNOSIS — E119 Type 2 diabetes mellitus without complications: Secondary | ICD-10-CM | POA: Diagnosis not present

## 2023-03-22 DIAGNOSIS — I10 Essential (primary) hypertension: Secondary | ICD-10-CM | POA: Diagnosis not present

## 2023-03-22 DIAGNOSIS — R42 Dizziness and giddiness: Secondary | ICD-10-CM | POA: Insufficient documentation

## 2023-03-22 LAB — CBC WITH DIFFERENTIAL/PLATELET
Abs Immature Granulocytes: 0.03 10*3/uL (ref 0.00–0.07)
Basophils Absolute: 0.1 10*3/uL (ref 0.0–0.1)
Basophils Relative: 1 %
Eosinophils Absolute: 0.1 10*3/uL (ref 0.0–0.5)
Eosinophils Relative: 1 %
HCT: 31.5 % — ABNORMAL LOW (ref 36.0–46.0)
Hemoglobin: 10.7 g/dL — ABNORMAL LOW (ref 12.0–15.0)
Immature Granulocytes: 0 %
Lymphocytes Relative: 13 %
Lymphs Abs: 1.3 10*3/uL (ref 0.7–4.0)
MCH: 29.6 pg (ref 26.0–34.0)
MCHC: 34 g/dL (ref 30.0–36.0)
MCV: 87 fL (ref 80.0–100.0)
Monocytes Absolute: 0.7 10*3/uL (ref 0.1–1.0)
Monocytes Relative: 7 %
Neutro Abs: 7.8 10*3/uL — ABNORMAL HIGH (ref 1.7–7.7)
Neutrophils Relative %: 78 %
Platelets: 205 10*3/uL (ref 150–400)
RBC: 3.62 MIL/uL — ABNORMAL LOW (ref 3.87–5.11)
RDW: 12.7 % (ref 11.5–15.5)
WBC: 10 10*3/uL (ref 4.0–10.5)
nRBC: 0 % (ref 0.0–0.2)

## 2023-03-22 LAB — MAGNESIUM: Magnesium: 1.5 mg/dL — ABNORMAL LOW (ref 1.7–2.4)

## 2023-03-22 LAB — COMPREHENSIVE METABOLIC PANEL
ALT: 14 U/L (ref 0–44)
AST: 17 U/L (ref 15–41)
Albumin: 4.2 g/dL (ref 3.5–5.0)
Alkaline Phosphatase: 57 U/L (ref 38–126)
Anion gap: 9 (ref 5–15)
BUN: 16 mg/dL (ref 8–23)
CO2: 20 mmol/L — ABNORMAL LOW (ref 22–32)
Calcium: 9.1 mg/dL (ref 8.9–10.3)
Chloride: 101 mmol/L (ref 98–111)
Creatinine, Ser: 0.98 mg/dL (ref 0.44–1.00)
GFR, Estimated: >60 mL/min (ref >60)
Glucose, Bld: 174 mg/dL — ABNORMAL HIGH (ref 70–99)
Potassium: 3.9 mmol/L (ref 3.5–5.1)
Sodium: 130 mmol/L — ABNORMAL LOW (ref 135–145)
Total Bilirubin: 0.7 mg/dL (ref 0.3–1.2)
Total Protein: 6.9 g/dL (ref 6.5–8.1)

## 2023-03-22 LAB — TROPONIN I (HIGH SENSITIVITY)
Troponin I (High Sensitivity): 7 ng/L (ref <18)
Troponin I (High Sensitivity): 10 ng/L (ref <18)

## 2023-03-22 LAB — TSH: TSH: 1.368 u[IU]/mL (ref 0.350–4.500)

## 2023-03-22 MED ORDER — MAGNESIUM SULFATE 2 GM/50ML IV SOLN
2.0000 g | Freq: Once | INTRAVENOUS | Status: AC
  Administered 2023-03-22: 2 g via INTRAVENOUS
  Filled 2023-03-22: qty 50

## 2023-03-22 MED ORDER — SODIUM CHLORIDE 0.9 % IV BOLUS
1000.0000 mL | Freq: Once | INTRAVENOUS | Status: AC
  Administered 2023-03-22: 1000 mL via INTRAVENOUS

## 2023-03-22 NOTE — ED Notes (Signed)
MD at bedside. EKG completed and handed to MD. Pt placed on cardiac monitor

## 2023-03-22 NOTE — ED Triage Notes (Signed)
Pt presents to the Ed via [facility] from home for dizziness and heart palpitations. Pt states that the dizziness is more prominent when she is lying down. States that this started about 1.5 hours ago. Pt does report the last time she had heart palpitations it was from anxiety. States that she does feel a little anxious now, but states that the dizziness is different and new. Pt appears to be calm and cooperative in triage. Was given bolus with EMS.

## 2023-03-22 NOTE — ED Provider Notes (Signed)
[facility] Provider Note    Event Date/Time   First MD Initiated Contact with Patient 03/22/23 1833     (approximate)   History   Dizziness and Palpitations   HPI  Miranda Delgado is a 72 y.o. female with history of T2DM, HTN, anxiety presenting to the emergency department for evaluation of palpitations. She was sitting on her couch when she had onset of palpitations with associated dizziness described as lightheadedness.  No syncope.  She does report that she has had multiple similar episodes in the past that were attributed to anxiety.  She does follow with a cardiologist and reports that about a year ago she wore a cardiac monitor that did not pick up any arrhythmias.  She did see a psychiatrist who placed her on some medicine, but she had improved so a few months ago she was taken off of this and had not had recurrent episodes until today.  No chest pain.  No shortness of breath.  At the time of my initial evaluation, patient does report some ongoing lightheadedness, but does feel that her palpitations are improved.  No spinning sensation.  No numbness, tingling, focal weakness, gait instability, vision changes.     Physical Exam   Triage Vital Signs: ED Triage Vitals  Enc Vitals Group     BP 03/22/23 1833 (!) 147/66     Pulse Rate 03/22/23 1833 96     Resp 03/22/23 1833 18     Temp 03/22/23 1833 98.2 F (36.8 C)     Temp Source 03/22/23 1833 Oral     SpO2 03/22/23 1831 98 %     Weight 03/22/23 1841 135 lb (61.2 kg)     Height 03/22/23 1841 5\' 3"  (1.6 m)     Head Circumference --      Peak Flow --      Pain Score 03/22/23 1841 0     Pain Loc --      Pain Edu? --      Excl. in GC? --     Most recent vital signs: Vitals:   03/22/23 1833 03/22/23 1900  BP: (!) 147/66 (!) 142/57  Pulse: 96 91  Resp: 18 15  Temp: 98.2 F (36.8 C)   SpO2: 96% 98%     General: Awake, interactive  CV:  Regular rate, good peripheral perfusion.   Resp:  Lungs clear, unlabored respirations.  Abd:  Soft, nondistended.  Neuro:  Alert and oriented, normal extraocular movements, symmetric facial movement, sensation intact over bilateral upper and lower extremities with 5 out of 5 strength.  Normal finger-to-nose testing.   ED Results / Procedures / Treatments   Labs (all labs ordered are listed, but only abnormal results are displayed) Labs Reviewed  COMPREHENSIVE METABOLIC PANEL - Abnormal; Notable for the following components:      Result Value   Sodium 130 (*)    CO2 20 (*)    Glucose, Bld 174 (*)    All other components within normal limits  CBC WITH DIFFERENTIAL/PLATELET - Abnormal; Notable for the following components:   RBC 3.62 (*)    Hemoglobin 10.7 (*)    HCT 31.5 (*)    Neutro Abs 7.8 (*)    All other components within normal limits  MAGNESIUM - Abnormal; Notable for the following components:   Magnesium 1.5 (*)    All other components within normal limits  TSH  TROPONIN I (HIGH SENSITIVITY)     EKG EKG independently  reviewed interpreted by myself (ER attending) demonstrates:  EKG demonstrates sinus rhythm at a rate of 97, PR 192, QRS 72, QTc 429, no acute ST changes  RADIOLOGY Imaging independently reviewed and interpreted by myself demonstrates:    PROCEDURES:  Critical Care performed: No  Procedures   MEDICATIONS ORDERED IN ED: Medications  sodium chloride 0.9 % bolus 1,000 mL (1,000 mLs Intravenous New Bag/Given 03/22/23 1851)     IMPRESSION / MDM / ASSESSMENT AND PLAN / ED COURSE  I reviewed the triage vital signs and the nursing notes.  Differential diagnosis includes, but is not limited to, arrhythmia, anemia, electrolyte abnormality, thyroid dysfunction, lower suspicion ACS  Patient's presentation is most consistent with acute presentation with potential threat to life or bodily function.  72 year old female presenting to the emergency department for evaluation of palpitations and  lightheadedness.  Has had similar episodes attributed to anxiety, but arrhythmia certainly remains on the differential.  Patient will be monitored on telemetry.  Will obtain labs, EKG, chest x-Starling Christofferson.  Episode began a few hours prior to presentation so we will plan on repeat troponin.    Clinical Course as of 03/22/23 1936  Wed Mar 22, 2023  1934 Initial lab work demonstrates mild anemia, not significant change from prior.  CMP without significant derangement.  Normal TSH and troponin.  Magnesium slightly low at 1.5, will order repletion.  Given that symptoms started only a few hours prior to presentation, will obtain a repeat troponin.  If this remains reassuring, do think patient can be discharged home for continued outpatient follow-up with cardiology and her psychiatrist.  Signed out to oncoming provider pending repeat troponin and disposition. [NR]    Clinical Course User Index [NR] Trinna Post, MD     FINAL CLINICAL IMPRESSION(S) / ED DIAGNOSES   Final diagnoses:  Palpitations  Lightheadedness     Rx / DC Orders   ED Discharge Orders     None        Note:  This document was prepared using Dragon voice recognition software and may include unintentional dictation errors.   Trinna Post, MD 03/22/23 [phone]

## 2023-03-22 NOTE — ED Notes (Signed)
Pt verbalizes understanding of discharge instructions. Opportunity for questioning and answers were provided. Pt discharged from ED to home with husband.  

## 2023-03-23 ENCOUNTER — Encounter: Payer: Self-pay | Admitting: Psychiatry

## 2023-03-23 ENCOUNTER — Emergency Department (EMERGENCY_DEPARTMENT_HOSPITAL)
Admission: EM | Admit: 2023-03-23 | Discharge: 2023-03-23 | Disposition: A | Discharge status: Other Acute Inpatient Hospital | Payer: Medicare Other | Source: Home / Self Care | Attending: Emergency Medicine | Admitting: Emergency Medicine

## 2023-03-23 ENCOUNTER — Emergency Department
Admission: EM | Admit: 2023-03-23 | Discharge: 2023-03-23 | Disposition: A | Discharge status: Home / Self Care | Payer: Medicare Other | Source: Home / Self Care | Attending: Emergency Medicine | Admitting: Emergency Medicine

## 2023-03-23 ENCOUNTER — Encounter: Payer: Self-pay | Admitting: Emergency Medicine

## 2023-03-23 ENCOUNTER — Other Ambulatory Visit: Payer: Self-pay

## 2023-03-23 ENCOUNTER — Inpatient Hospital Stay
Admission: AD | Admit: 2023-03-23 | Discharge: 2023-03-31 | DRG: 881 | Disposition: A | Discharge status: Home / Self Care | Payer: Medicare Other | Source: Intra-hospital | Attending: Psychiatry | Admitting: Psychiatry

## 2023-03-23 DIAGNOSIS — R002 Palpitations: Secondary | ICD-10-CM | POA: Insufficient documentation

## 2023-03-23 DIAGNOSIS — I10 Essential (primary) hypertension: Secondary | ICD-10-CM | POA: Insufficient documentation

## 2023-03-23 DIAGNOSIS — E119 Type 2 diabetes mellitus without complications: Secondary | ICD-10-CM | POA: Insufficient documentation

## 2023-03-23 DIAGNOSIS — F333 Major depressive disorder, recurrent, severe with psychotic symptoms: Secondary | ICD-10-CM | POA: Diagnosis not present

## 2023-03-23 DIAGNOSIS — F419 Anxiety disorder, unspecified: Secondary | ICD-10-CM | POA: Diagnosis present

## 2023-03-23 DIAGNOSIS — F411 Generalized anxiety disorder: Secondary | ICD-10-CM | POA: Insufficient documentation

## 2023-03-23 DIAGNOSIS — Z79899 Other long term (current) drug therapy: Secondary | ICD-10-CM | POA: Insufficient documentation

## 2023-03-23 DIAGNOSIS — F329 Major depressive disorder, single episode, unspecified: Secondary | ICD-10-CM | POA: Diagnosis not present

## 2023-03-23 DIAGNOSIS — R45851 Suicidal ideations: Secondary | ICD-10-CM | POA: Diagnosis present

## 2023-03-23 DIAGNOSIS — Z794 Long term (current) use of insulin: Secondary | ICD-10-CM | POA: Diagnosis not present

## 2023-03-23 DIAGNOSIS — Z7984 Long term (current) use of oral hypoglycemic drugs: Secondary | ICD-10-CM | POA: Diagnosis not present

## 2023-03-23 DIAGNOSIS — Z7982 Long term (current) use of aspirin: Secondary | ICD-10-CM | POA: Diagnosis not present

## 2023-03-23 LAB — CBC
HCT: 33.6 % — ABNORMAL LOW (ref 36.0–46.0)
Hemoglobin: 11.1 g/dL — ABNORMAL LOW (ref 12.0–15.0)
MCH: 28.8 pg (ref 26.0–34.0)
MCHC: 33 g/dL (ref 30.0–36.0)
MCV: 87 fL (ref 80.0–100.0)
Platelets: 233 10*3/uL (ref 150–400)
RBC: 3.86 MIL/uL — ABNORMAL LOW (ref 3.87–5.11)
RDW: 13 % (ref 11.5–15.5)
WBC: 8 10*3/uL (ref 4.0–10.5)
nRBC: 0 % (ref 0.0–0.2)

## 2023-03-23 LAB — URINALYSIS, ROUTINE W REFLEX MICROSCOPIC
Bilirubin Urine: NEGATIVE
Glucose, UA: 50 mg/dL — AB
Hgb urine dipstick: NEGATIVE
Ketones, ur: 5 mg/dL — AB
Nitrite: NEGATIVE
Protein, ur: NEGATIVE mg/dL
Specific Gravity, Urine: 1.01 (ref 1.005–1.030)
pH: 5 (ref 5.0–8.0)

## 2023-03-23 LAB — BASIC METABOLIC PANEL
Anion gap: 10 (ref 5–15)
BUN: 12 mg/dL (ref 8–23)
CO2: 20 mmol/L — ABNORMAL LOW (ref 22–32)
Calcium: 9.5 mg/dL (ref 8.9–10.3)
Chloride: 103 mmol/L (ref 98–111)
Creatinine, Ser: 0.92 mg/dL (ref 0.44–1.00)
GFR, Estimated: >60 mL/min (ref >60)
Glucose, Bld: 181 mg/dL — ABNORMAL HIGH (ref 70–99)
Potassium: 3.7 mmol/L (ref 3.5–5.1)
Sodium: 133 mmol/L — ABNORMAL LOW (ref 135–145)

## 2023-03-23 MED ORDER — MAGNESIUM HYDROXIDE 400 MG/5ML PO SUSP: 30.0000 mL | Freq: Every day | ORAL | Status: DC | PRN

## 2023-03-23 MED ORDER — METFORMIN HCL 500 MG PO TABS
500.0000 mg | ORAL_TABLET | Freq: Two times a day (BID) | ORAL | Status: DC
  Administered 2023-03-24 – 2023-03-31 (×15): 500 mg via ORAL
  Filled 2023-03-23 (×15): qty 1

## 2023-03-23 MED ORDER — FLUOXETINE HCL 20 MG PO CAPS: 20.0000 mg | ORAL_CAPSULE | Freq: Every morning | ORAL | Status: DC

## 2023-03-23 MED ORDER — VITAMIN B-12 1000 MCG PO TABS
1000.0000 ug | ORAL_TABLET | Freq: Every day | ORAL | Status: DC
  Administered 2023-03-24 – 2023-03-31 (×8): 1000 ug via ORAL
  Filled 2023-03-23 (×8): qty 1

## 2023-03-23 MED ORDER — SEMAGLUTIDE(0.25 OR 0.5MG/DOS) 2 MG/3ML ~~LOC~~ SOPN
0.2500 mg | PEN_INJECTOR | SUBCUTANEOUS | Status: DC
  Administered 2023-03-26: 0.25 mg via SUBCUTANEOUS
  Filled 2023-03-23: qty 3

## 2023-03-23 MED ORDER — METOPROLOL TARTRATE 25 MG PO TABS: 25.0000 mg | ORAL_TABLET | Freq: Two times a day (BID) | ORAL | 0 refills | Status: AC

## 2023-03-23 MED ORDER — METFORMIN HCL 500 MG PO TABS: 500.0000 mg | ORAL_TABLET | Freq: Two times a day (BID) | ORAL | Status: DC

## 2023-03-23 MED ORDER — SIMVASTATIN 10 MG PO TABS: 20.0000 mg | ORAL_TABLET | Freq: Every day | ORAL | Status: DC

## 2023-03-23 MED ORDER — AMLODIPINE BESYLATE 5 MG PO TABS
10.0000 mg | ORAL_TABLET | Freq: Every day | ORAL | Status: DC
  Administered 2023-03-24 – 2023-03-31 (×7): 10 mg via ORAL
  Filled 2023-03-23 (×9): qty 2

## 2023-03-23 MED ORDER — DIPHENHYDRAMINE HCL 25 MG PO CAPS: 50.0000 mg | ORAL_CAPSULE | Freq: Three times a day (TID) | ORAL | Status: DC | PRN

## 2023-03-23 MED ORDER — ASPIRIN 81 MG PO TBEC: 81.0000 mg | DELAYED_RELEASE_TABLET | Freq: Every day | ORAL | Status: DC

## 2023-03-23 MED ORDER — MAGNESIUM OXIDE -MG SUPPLEMENT 400 (240 MG) MG PO TABS: 400.0000 mg | ORAL_TABLET | Freq: Every day | ORAL | Status: DC

## 2023-03-23 MED ORDER — ASPIRIN 81 MG PO TBEC
81.0000 mg | DELAYED_RELEASE_TABLET | Freq: Every day | ORAL | Status: DC
  Filled 2023-03-23: qty 1

## 2023-03-23 MED ORDER — MAGNESIUM OXIDE -MG SUPPLEMENT 400 (240 MG) MG PO TABS
400.0000 mg | ORAL_TABLET | Freq: Every day | ORAL | Status: DC
  Administered 2023-03-24 – 2023-03-31 (×8): 400 mg via ORAL
  Filled 2023-03-23 (×8): qty 1

## 2023-03-23 MED ORDER — RISPERIDONE 1 MG PO TABS: 1.0000 mg | ORAL_TABLET | Freq: Every day | ORAL | Status: DC

## 2023-03-23 MED ORDER — AMLODIPINE BESYLATE 5 MG PO TABS: 10.0000 mg | ORAL_TABLET | Freq: Every day | ORAL | Status: DC

## 2023-03-23 MED ORDER — ALUM & MAG HYDROXIDE-SIMETH 200-200-20 MG/5ML PO SUSP: 30.0000 mL | ORAL | Status: DC | PRN

## 2023-03-23 MED ORDER — RISPERIDONE 1 MG PO TABS
0.5000 mg | ORAL_TABLET | Freq: Once | ORAL | Status: AC
  Administered 2023-03-23: 0.5 mg via ORAL
  Filled 2023-03-23: qty 1

## 2023-03-23 MED ORDER — DIPHENHYDRAMINE HCL 50 MG/ML IJ SOLN: 50.0000 mg | Freq: Three times a day (TID) | INTRAMUSCULAR | Status: DC | PRN

## 2023-03-23 MED ORDER — SEMAGLUTIDE(0.25 OR 0.5MG/DOS) 2 MG/1.5ML ~~LOC~~ SOPN: 0.2500 mg | PEN_INJECTOR | SUBCUTANEOUS | Status: DC

## 2023-03-23 MED ORDER — SIMVASTATIN 20 MG PO TABS
20.0000 mg | ORAL_TABLET | Freq: Every day | ORAL | Status: DC
  Filled 2023-03-23: qty 1

## 2023-03-23 MED ORDER — HALOPERIDOL LACTATE 5 MG/ML IJ SOLN: 5.0000 mg | Freq: Three times a day (TID) | INTRAMUSCULAR | Status: DC | PRN

## 2023-03-23 MED ORDER — POLYSACCHARIDE IRON COMPLEX 150 MG PO CAPS
150.0000 mg | ORAL_CAPSULE | Freq: Every day | ORAL | Status: DC
  Administered 2023-03-23 – 2023-03-31 (×9): 150 mg via ORAL
  Filled 2023-03-23 (×9): qty 1

## 2023-03-23 MED ORDER — LORAZEPAM 2 MG/ML IJ SOLN: 2.0000 mg | Freq: Three times a day (TID) | INTRAMUSCULAR | Status: DC | PRN

## 2023-03-23 MED ORDER — HALOPERIDOL 5 MG PO TABS: 5.0000 mg | ORAL_TABLET | Freq: Three times a day (TID) | ORAL | Status: DC | PRN

## 2023-03-23 MED ORDER — POLYSACCHARIDE IRON COMPLEX 150 MG PO CAPS
150.0000 mg | ORAL_CAPSULE | Freq: Every day | ORAL | Status: DC
  Filled 2023-03-23: qty 1

## 2023-03-23 MED ORDER — LISINOPRIL 10 MG PO TABS: 40.0000 mg | ORAL_TABLET | Freq: Every day | ORAL | Status: DC

## 2023-03-23 MED ORDER — CYANOCOBALAMIN 500 MCG PO TABS: 1000.0000 ug | ORAL_TABLET | Freq: Every day | ORAL | Status: DC

## 2023-03-23 MED ORDER — ACETAMINOPHEN 325 MG PO TABS: 650.0000 mg | ORAL_TABLET | Freq: Four times a day (QID) | ORAL | Status: DC | PRN

## 2023-03-23 MED ORDER — METOPROLOL TARTRATE 25 MG PO TABS
25.0000 mg | ORAL_TABLET | Freq: Once | ORAL | Status: DC
  Filled 2023-03-23: qty 1

## 2023-03-23 MED ORDER — RISPERIDONE 1 MG PO TABS
1.0000 mg | ORAL_TABLET | Freq: Every day | ORAL | Status: DC
  Administered 2023-03-23 – 2023-03-30 (×8): 1 mg via ORAL
  Filled 2023-03-23 (×8): qty 1

## 2023-03-23 MED ORDER — LISINOPRIL 20 MG PO TABS
40.0000 mg | ORAL_TABLET | Freq: Every day | ORAL | Status: DC
  Administered 2023-03-24 – 2023-03-31 (×7): 40 mg via ORAL
  Filled 2023-03-23 (×9): qty 2

## 2023-03-23 MED ORDER — FLUOXETINE HCL 20 MG PO CAPS
20.0000 mg | ORAL_CAPSULE | Freq: Every morning | ORAL | Status: DC
  Administered 2023-03-24 – 2023-03-31 (×8): 20 mg via ORAL
  Filled 2023-03-23 (×8): qty 1

## 2023-03-23 MED ORDER — LORAZEPAM 1 MG PO TABS: 2.0000 mg | ORAL_TABLET | Freq: Three times a day (TID) | ORAL | Status: DC | PRN

## 2023-03-23 NOTE — ED Provider Notes (Signed)
[facility] Provider Note    Event Date/Time   First MD Initiated Contact with Patient 03/23/23 1302     (approximate)  History   Chief Complaint: Anxiety, Nausea, and light headed  HPI  Miranda Delgado is a 72 y.o. female with a past medical history of diabetes, hypertension, anxiety, presents to the emergency department for anxiety.  According to the patient this is her third visit in 24 hours to the emergency department for anxiety.  States she cannot think of anything that has caused her anxiety to escalate so much.  States she has been seen twice already and discharged home each time her anxiety has returned and she has to come back to the emergency department.  Patient states she had an appointment at 2 PM with her outpatient psychiatrist but she has since canceled this appointment as she was coming back to the emergency department due to significant anxiety.  Patient states she is feeling a little dizzy but denies any pain.  Specifically denies any chest pain abdominal pain denies any trouble breathing.  States she was having palpitations earlier today but that has resolved and she is now just having anxiety symptoms.  Patient was off of her Prozac for quite some time but restarted it this morning.  Patient is requesting to speak to a psychiatrist.  Physical Exam   Triage Vital Signs: ED Triage Vitals [03/23/23 1112]  Enc Vitals Group     BP (!) 155/77     Pulse Rate 98     Resp 16     Temp 98.2 F (36.8 C)     Temp Source Oral     SpO2 98 %     Weight 132 lb (59.9 kg)     Height 5\' 3"  (1.6 m)     Head Circumference      Peak Flow      Pain Score 0     Pain Loc      Pain Edu?      Excl. in GC?     Most recent vital signs: Vitals:   03/23/23 1112  BP: (!) 155/77  Pulse: 98  Resp: 16  Temp: 98.2 F (36.8 C)  SpO2: 98%    General: Awake, no distress.  Patient is anxious appearing. CV:  Good peripheral perfusion.  Regular rate and rhythm   Resp:  Normal effort.  Equal breath sounds bilaterally.  Abd:  No distention.  Soft, nontender.   ED Results / Procedures / Treatments   EKG  EKG viewed and interpreted by myself shows normal sinus rhythm at 92 bpm with a narrow QRS, normal axis, normal intervals, nonspecific ST changes   IMPRESSION / MDM / ASSESSMENT AND PLAN / ED COURSE  I reviewed the triage vital signs and the nursing notes.  Patient's presentation is most consistent with acute presentation with potential threat to life or bodily function.  Patient presents emergency department for worsening anxiety symptoms.  Patient states over the last several days she has had significant anxiety.  This is the patient's third emergency department visit in 24 hours for the same.  Denies any specific medical complaints besides just feeling very anxious.  States earlier today she was feeling palpitations but those have since resolved.  EKG reassuring today.  Patient's chemistry and CBC are reassuring.  Patient states she was hoping to speak to a psychiatrist which is why she came back to the emergency department.  Will have psychiatry TTS evaluate to  see what else they can offer the patient for her significant anxiety symptoms.  Does not appear to have any specific medical complaint on review of systems questioning and overall appears well from medical standpoint besides being anxious appearing.  FINAL CLINICAL IMPRESSION(S) / ED DIAGNOSES   Anxiety  Note:  This document was prepared using Dragon voice recognition software and may include unintentional dictation errors.   Minna Antis, MD 03/23/23 1400

## 2023-03-23 NOTE — Progress Notes (Signed)
Patient admitted voluntary to [facility] from [facility] ED at approx. 1845 with a diagnosis of anxiety. Patient presents for assessment via WC but is ambulatory. She is A+O x4. Patient reports anxiety 8/10 but denies being depressed. "I couldn't wait for my psychiatrist. The appointment was at 2pm. That was just too long of a wait so I just decided to come here. I've been on and off Prozac for a while."   Patient's affect is appropriate, speech is logical and coherent. She denies SI/HI/AVH. Patient denies the use of a mobility aid at home. She denies the use of eyeglasses, dentures, or hearing aids. Reports last BM yesterday. Denies smoking cigarettes, alcohol use, and substance use. Patient reports living with her husband and enjoys being independent. States that her support system is her family (husband and daughter). She says her goal is to lower her level of anxiety.    Skin assessment performed with Amy, RN. Skin clean, dry, and intact. L big toe bruise that is in the healing stage.   Emotional support and reassurance provided throughout admission intake. Patient oriented to unit, room, and the call light.  POC reviewed with all questions answered.  Patient verbalized understanding. Consents signed, handout detailing the patient's rights and responsibilities provided. Will continue to monitor with ongoing Q15 minute safety checks per unit protocol.

## 2023-03-23 NOTE — Plan of Care (Signed)

## 2023-03-23 NOTE — Consult Note (Addendum)
[facility] Face-to-Face Psychiatry Consult   Reason for Consult: Anxiety Referring Physician: Paduchowski Patient Identification: Miranda Delgado MRN:  [id number] Principal Diagnosis: Anxiety state Diagnosis:  Principal Problem:   Anxiety state   Total Time spent with patient: 45 minutes  Subjective:   Miranda Delgado is a 72 y.o. female patient admitted with anxiety.  HPI: Patient presents to the ED for the third time in less than 24 hours with complaints of anxiety.  Patient had made an appointment with her outpatient psychiatrist, Dr. Ignatius Specking at 2 PM today; however, patient states that she got so anxious at home she could not wait and came back to the ED.  She is requesting to be hospitalized for her anxiety state.  On evaluation, patient is lying on stretcher with her feet in constant movement.  Husband at bedside.  She is appears anxious, but is alert and oriented x 4.  She states that she is so anxious that she cannot go home.  She does not disclose any particular reason for being anxious, although her husband has had some medical problems over the last few months.  Patient denies acute suicidal ideation, but states that "I do not care if I live or die."  She says she just wants this anxiety to stop.  Patient reports that she  stopped taking prescribed Prozac about a year ago.  She says she does not know why she stopped but she was feeling better and stopped it.  She reports that she was hospitalized about 3 years ago with depression and anxiety, including some paranoid delusions.  Patient reports that she was prescribed Risperdal at that time but she was taken off of it at discharge.  Reports that in the last week she did have a delusion when she went to pick up her granddaughter and she was not ready.  Patient says that she thought somebody told the granddaughter not to get ready.  She reports that she has not been sleeping well over the past several weeks.  She describes difficulty getting and  staying asleep.  She reports normal appetite.  Patient denies homicidal thoughts or intent.  Denies auditory or visual hallucinations.  Denies acute suicidal ideation, but relates to lack of motivation for life due to what she describes as constant anxiety.  This Clinical research associate contacted patient's outpatient provider, Dr. Caryn Section, via secure chat.  Dr. Maryruth Bun confirmed medications that she has currently prescribed for patient and recommended that they be restarted.   This Clinical research associate restarted Prozac and Risperdal.  Will recommend inpatient psychiatric hospitalization with reassessment tomorrow if patient is not offered a bed in the meantime.  Patient and husband are in agreement.   Past Psychiatric History: Anxiety and depression.  Patient reports she has a history of paranoid delusions.  Risk to Self:   Risk to Others:   Prior Inpatient Therapy:   Prior Outpatient Therapy:    Past Medical History:  Past Medical History:  Diagnosis Date   Diabetes mellitus without complication (HCC)    Hypertension     Past Surgical History:  Procedure Laterality Date   CESAREAN SECTION     COLONOSCOPY WITH PROPOFOL N/A 09/19/2022   Procedure: COLONOSCOPY WITH PROPOFOL;  Surgeon: Jaynie Collins, DO;  Location: [facility] ENDOSCOPY;  Service: Gastroenterology;  Laterality: N/A;   TONSILLECTOMY     Family History: No family history on file. Family Psychiatric  History: Unknown Social History:  Social History   Substance and Sexual Activity  Alcohol Use Not  Currently     Social History   Substance and Sexual Activity  Drug Use Not Currently    Social History   Socioeconomic History   Marital status: Married    Spouse name: Not on file   Number of children: Not on file   Years of education: Not on file   Highest education level: Not on file  Occupational History   Not on file  Tobacco Use   Smoking status: Never   Smokeless tobacco: Never  Vaping Use   Vaping Use: Never used  Substance and  Sexual Activity   Alcohol use: Not Currently   Drug use: Not Currently   Sexual activity: Not Currently  Other Topics Concern   Not on file  Social History Narrative   Not on file   Social Determinants of Health   Financial Resource Strain: Not on file  Food Insecurity: Not on file  Transportation Needs: Not on file  Physical Activity: Not on file  Stress: Not on file  Social Connections: Not on file   Additional Social History:    Allergies:  No Known Allergies  Labs:  Results for orders placed or performed during the hospital encounter of 03/23/23 (from the past 48 hour(s))  Basic metabolic panel     Status: Abnormal   Collection Time: 03/23/23 11:15 AM  Result Value Ref Range   Sodium 133 (L) 135 - 145 mmol/L   Potassium 3.7 3.5 - 5.1 mmol/L   Chloride 103 98 - 111 mmol/L   CO2 20 (L) 22 - 32 mmol/L   Glucose, Bld 181 (H) 70 - 99 mg/dL    Comment: Glucose reference range applies only to samples taken after fasting for at least 8 hours.   BUN 12 8 - 23 mg/dL   Creatinine, Ser 4.09 0.44 - 1.00 mg/dL   Calcium 9.5 8.9 - 81.1 mg/dL   GFR, Estimated >91 >47 mL/min    Comment: (NOTE) Calculated using the CKD-EPI Creatinine Equation (2021)    Anion gap 10 5 - 15    Comment: Performed at [facility], [address] [place], [place] [address]  CBC     Status: Abnormal   Collection Time: 03/23/23 11:15 AM  Result Value Ref Range   WBC 8.0 4.0 - 10.5 K/uL   RBC 3.86 (L) 3.87 - 5.11 MIL/uL   Hemoglobin 11.1 (L) 12.0 - 15.0 g/dL   HCT 21.3 (L) 08.6 - 57.8 %   MCV 87.0 80.0 - 100.0 fL   MCH 28.8 26.0 - 34.0 pg   MCHC 33.0 30.0 - 36.0 g/dL   RDW 46.9 62.9 - 52.8 %   Platelets 233 150 - 400 K/uL   nRBC 0.0 0.0 - 0.2 %    Comment: Performed at [facility], [address] [place], [place] [address]  Urinalysis, Routine w reflex microscopic -Urine, Clean Catch     Status: Abnormal   Collection Time: 03/23/23  2:35 PM  Result Value Ref Range   Color,  Urine YELLOW (A) YELLOW   APPearance CLEAR (A) CLEAR   Specific Gravity, Urine 1.010 1.005 - 1.030   pH 5.0 5.0 - 8.0   Glucose, UA 50 (A) NEGATIVE mg/dL   Hgb urine dipstick NEGATIVE NEGATIVE   Bilirubin Urine NEGATIVE NEGATIVE   Ketones, ur 5 (A) NEGATIVE mg/dL   Protein, ur NEGATIVE NEGATIVE mg/dL   Nitrite NEGATIVE NEGATIVE   Leukocytes,Ua TRACE (A) NEGATIVE   RBC / HPF 0-5 0 - 5 RBC/hpf  WBC, UA 0-5 0 - 5 WBC/hpf   Bacteria, UA RARE (A) NONE SEEN   Squamous Epithelial / HPF 0-5 0 - 5 /HPF   Mucus PRESENT     Comment: Performed at [facility], [address] [place], [place] [address]    Current Facility-Administered Medications  Medication Dose Route Frequency Provider Last Rate Last Admin   [START ON 03/24/2023] FLUoxetine (PROZAC) capsule 20 mg  20 mg Oral q morning Ennis Heavner F, NP       risperiDONE (RISPERDAL) tablet 1 mg  1 mg Oral QHS Vanetta Mulders, NP       Current Outpatient Medications  Medication Sig Dispense Refill   amLODipine (NORVASC) 10 MG tablet Take 10 mg by mouth daily.     aspirin EC 81 MG tablet Take 81 mg by mouth daily.     cyanocobalamin (VITAMIN B12) 1000 MCG tablet Take 1,000 mcg by mouth daily.     FERREX 150 150 MG capsule Take 150 mg by mouth daily.     FLUoxetine (PROZAC) 20 MG capsule Take 20 mg by mouth daily.     lisinopril (PRINIVIL,ZESTRIL) 30 MG tablet Take 30 mg by mouth daily.     magnesium oxide (MAG-OX) 400 (240 Mg) MG tablet Take 400 mg by mouth daily.     metFORMIN (GLUCOPHAGE) 500 MG tablet Take 500 mg by mouth 2 (two) times daily.     SEMAGLUTIDE,0.25 OR 0.5MG /DOS, Jordan Inject into the skin.     simvastatin (ZOCOR) 20 MG tablet Take 20 mg by mouth daily.     HUMALOG KWIKPEN 100 UNIT/ML KiwkPen Inject 10 Units into the skin daily. Take with largest meal of the day. (Patient not taking: Reported on 09/19/2022)     LEVEMIR FLEXTOUCH 100 UNIT/ML Pen Inject 50 Units into the skin at bedtime. (Patient not taking:  Reported on 09/19/2022)     metoprolol tartrate (LOPRESSOR) 25 MG tablet Take 1 tablet (25 mg total) by mouth 2 (two) times daily for 15 days. 30 tablet 0   omeprazole (PRILOSEC) 20 MG capsule Take 20 mg by mouth daily. (Patient not taking: Reported on 09/19/2022)      Musculoskeletal: Strength & Muscle Tone: within normal limits Gait & Station:  Not observed Patient leans: N/A    Psychiatric Specialty Exam:  Presentation  General Appearance: Appropriate for Environment; Casual  Eye Contact:Good  Speech:Clear and Coherent  Speech Volume:Normal  Handedness:Right   Mood and Affect  Mood:Anxious  Affect:Depressed   Thought Process  Thought Processes:Coherent  Descriptions of Associations:Intact  Orientation:Full (Time, Place and Person)  Thought Content:WDL (History of some paranoid thoughts recently)  History of Schizophrenia/Schizoaffective disorder:No  Duration of Psychotic Symptoms:No data recorded Hallucinations:Hallucinations: None  Ideas of Reference:Paranoia  Suicidal Thoughts:Suicidal Thoughts: No  Homicidal Thoughts:Homicidal Thoughts: No   Sensorium  Memory:Immediate Good  Judgment:Fair  Insight:Fair   Executive Functions  Concentration:Fair  Attention Span:Fair  Recall:Fair  Fund of Knowledge:No data recorded Language:Fair   Psychomotor Activity  Psychomotor Activity:Psychomotor Activity: Restlessness   Assets  Assets:Communication Skills; Desire for Improvement; Financial Resources/Insurance; Housing; Social Support; Resilience; Physical Health   Sleep  Sleep:Sleep: Poor   Physical Exam: Physical Exam HENT:     Head: Normocephalic.  Cardiovascular:     Rate and Rhythm: Normal rate.  Pulmonary:     Effort: Pulmonary effort is normal.  Skin:    General: Skin is dry.  Neurological:     Mental Status: She is alert and oriented to person,  place, and time.  Psychiatric:        Attention and Perception: Attention  normal.        Mood and Affect: Mood is anxious.        Speech: Speech normal.        Behavior: Behavior is cooperative.        Thought Content: Thought content is not paranoid (History of within the last few weeks).        Cognition and Memory: Cognition normal.        Judgment: Judgment normal.    ROS Blood pressure (!) 137/54, pulse 74, temperature 98.2 F (36.8 C), temperature source Oral, resp. rate 16, height 5\' 3"  (1.6 m), weight 59.9 kg, SpO2 100 %. Body mass index is 23.38 kg/m.  Treatment Plan Summary: Daily contact with patient to assess and evaluate symptoms and progress in treatment, Medication management, and Plan : We started medications that are prescribed by her psychiatrist.  Risperdal 1 mg at bedtime for paranoid thoughts. gave Risperdal 0.5 mg 1 time now.Prozac 20 mg daily, to start 03/24/23 (patient already took 1 dose today ).  Recommend psychiatric admission to geriatric unit at this time.  However, if patient is not offered an inpatient bed today,will reevaluate need for inpatient admission tomorrow.  Dr. Maryruth Bun has indicated that she is available to work patient in for an outpatient appointment this weekend or next week.  Disposition: Recommend psychiatric Inpatient admission when medically cleared. However, if patient is not offered an inpatient bed today,will reevaluate need for inpatient admission tomorrow.  Dr. Maryruth Bun has indicated that she is available to work patient in for an outpatient appointment this weekend or next week.   Vanetta Mulders, NP 03/23/2023 4:44 PM

## 2023-03-23 NOTE — ED Notes (Signed)
Pt stopped prozac months ago and took 1 tablet today. Pt states she has psychiatrist but that she could not see her until 2 pm today so she came here. Pt states her anxiety started 3 days ago. Pts husband had appendicitis last week and was in [facility] pt states anxiety started about the same time husband was dc'd from hospital. Pt states she has a daughter that is local.  Pt states she has not been sleeping well even prior to husbands hospital stay.

## 2023-03-23 NOTE — ED Notes (Signed)
Home Meds reordered Report called to [facility] Rolena Infante VS updated and PIV removed

## 2023-03-23 NOTE — ED Notes (Signed)
Daily meds Metphorim 500mg  BID Amlodapine 10 mg daily Losinapril 40mg  daily Aspirin 81 mg daily Simvastatin daily Ozempic 0.25mg  weekly Magnesium dsily B12 daily Iron daily

## 2023-03-23 NOTE — ED Provider Notes (Addendum)
[facility] Provider Note    Event Date/Time   First MD Initiated Contact with Patient 03/23/23 [phone]     (approximate)   History   Palpitations   HPI  Miranda Delgado is a 72 y.o. female with a history of hypertension diabetes anxiety who comes ED complaining of palpitations that started this evening, feeling like her heart was racing.  She was seen in the ED yesterday evening for similar symptoms, workup was reassuring.  Patient denies any chest pain or shortness of breath or exertional symptoms.  States that she keeps feeling like her heart is racing and she feels anxious.  She does note that she has been prescribed Prozac but has not taken it in quite a while because she did not think that she needed it.  She does have an appointment with her psychiatrist later today.     Physical Exam   Triage Vital Signs: ED Triage Vitals  Enc Vitals Group     BP 03/23/23 0429 (!) 151/67     Pulse Rate 03/23/23 0429 91     Resp 03/23/23 0429 18     Temp 03/23/23 0429 98.3 F (36.8 C)     Temp Source 03/23/23 0429 Oral     SpO2 03/23/23 0429 99 %     Weight 03/23/23 0427 135 lb (61.2 kg)     Height 03/23/23 0427 5\' 3"  (1.6 m)     Head Circumference --      Peak Flow --      Pain Score 03/23/23 0427 0     Pain Loc --      Pain Edu? --      Excl. in GC? --     Most recent vital signs: Vitals:   03/23/23 0429 03/23/23 0500  BP: (!) 151/67 (!) 145/60  Pulse: 91 77  Resp: 18 15  Temp: 98.3 F (36.8 C)   SpO2: 99% 97%    General: Awake, no distress.  CV:  Good peripheral perfusion.  Regular rate and rhythm, normal distal pulses Resp:  Normal effort.  Clear to auscultation bilaterally Abd:  No distention.  Other:  Moist oral mucosa.   ED Results / Procedures / Treatments   Labs (all labs ordered are listed, but only abnormal results are displayed) Labs Reviewed - No data to display   EKG interpreted by me Normal sinus rhythm rate of 90.   Normal axis and intervals.  Poor R wave progression.  Normal ST segments and T waves.  RADIOLOGY    PROCEDURES:  .1-3 Lead EKG Interpretation  Performed by: Sharman Cheek, MD Authorized by: Sharman Cheek, MD     Interpretation: normal     ECG rate:  80   ECG rate assessment: normal     Rhythm: sinus rhythm     Ectopy: none     Conduction: normal      MEDICATIONS ORDERED IN ED: Medications  metoprolol tartrate (LOPRESSOR) tablet 25 mg (25 mg Oral Patient Refused/Not Given 03/23/23 0507)     IMPRESSION / MDM / ASSESSMENT AND PLAN / ED COURSE  I reviewed the triage vital signs and the nursing notes.                             Patient presents with symptomatic palpitations, I suspect this is anxiety related.  Reviewed her recent workup which was all unremarkable.  Vital signs are normal, she is not  having any specific pain or other focal complaints.  Exam is reassuring.  Will try low-dose metoprolol to see if this helps with her symptoms.  Encouraged her to talk to her psychiatrist about restarting Prozac or other therapy       FINAL CLINICAL IMPRESSION(S) / ED DIAGNOSES   Final diagnoses:  Palpitations     Rx / DC Orders   ED Discharge Orders          Ordered    metoprolol tartrate (LOPRESSOR) 25 MG tablet  2 times daily        03/23/23 0507             Note:  This document was prepared using Dragon voice recognition software and may include unintentional dictation errors.   Sharman Cheek, MD 03/23/23 1610    Sharman Cheek, MD 03/23/23 [phone]

## 2023-03-23 NOTE — ED Notes (Signed)
Pt ambulatory to rm 17, pt placed on cardiac monitor, call light within reach, family at bedside.

## 2023-03-23 NOTE — Tx Team (Signed)
Initial Treatment Plan 03/23/2023 7:04 PM Miranda Delgado UEA:[id number]    PATIENT STRESSORS: Medication change or noncompliance     PATIENT STRENGTHS: Ability for insight  Capable of independent living  Communication skills  Supportive family/friends    PATIENT IDENTIFIED PROBLEMS:   "I have not been taking my Prozac."  "My anxiety is high"                 DISCHARGE CRITERIA:  Ability to meet basic life and health needs Adequate post-discharge living arrangements Improved stabilization in mood, thinking, and/or behavior  PRELIMINARY DISCHARGE PLAN: Attend aftercare/continuing care group Attend PHP/IOP Return to previous living arrangement  PATIENT/FAMILY INVOLVEMENT: This treatment plan has been presented to and reviewed with the patient, Miranda Delgado. The patient has been given the opportunity to ask questions and make suggestions.   Luane School, RN 03/23/2023, 7:04 PM

## 2023-03-23 NOTE — ED Triage Notes (Signed)
Pt presents ambulatory to triage via POV with complaints of palpaitations and anxiety as a result of the sensation she is experiencing. Pt notes being seen here earlier today for same. She notes that things had improved and she had been discharged but the "racing sensation" has returned. Denies pain at this time. A&Ox4 at this time. Denies CP or SOB.

## 2023-03-23 NOTE — BH Assessment (Signed)
Comprehensive Clinical Assessment (CCA) Note  03/23/2023 Miranda Delgado [id number]  Chief Complaint:  Chief Complaint  Patient presents with   Anxiety   Nausea   light headed   Visit Diagnosis: Anxiety   Miranda Delgado is a 72 year old female who presents to the ER due to increase anxiety. This is the third visit to the ER within twenty-four hours. Patient reports of stopping her medications approximately a year ago, but hadn't told her outpatient provider. She's unable to sleep and worrying about things throughout the day. Approximately a week ago, she became upset when her granddaughter wasn't ready when she picked her up. She had the thought, someone told her not to get ready. After looking back at the situation, she was able to acknowledge her thoughts and emotional response to the situation was irrational. Patient further explained, approximately three years ago, she was inpatient for similar thoughts and paranoia. She thought her family members were spying on her through the television and other things. She's starting to have some similar symptoms and is another factor in the anxiety. Patient states, "I'm anxious about getting anxious." Husband was present during the interview, and was able to provide additional information for the assessment.  During the interview, the patient was calm, cooperative and pleasant. She was able to provide appropriate answers to the questions. She denies SI/HI and AV/H. However, she shared she hopes the "Good Lord don't' wake me up." She has no thoughts, intention or plans of ending her life, due to religious convictions and beliefs. She acknowledges she has some depression.  CCA Screening, Triage and Referral (STR)  Patient Reported Information How did you hear about [place]? Self  What Is the Reason for Your Visit/Call Today? Increase anxiety and starting to have irrational thoughts.  How Long Has This Been Causing You Problems? 1 wk - 1 month  What Do  You Feel Would Help You the Most Today? Treatment for Depression or other mood problem   Have You Recently Had Any Thoughts About Hurting Yourself? No  Are You Planning to Commit Suicide/Harm Yourself At This time? No   Flowsheet Row ED from 03/23/2023 in [facility] Emergency Department at [facility] Most recent reading at 03/23/2023 11:13 AM ED from 03/23/2023 in [facility] Emergency Department at [facility] Most recent reading at 03/23/2023  4:27 AM ED from 03/22/2023 in [facility] Emergency Department at [facility] Most recent reading at 03/22/2023  6:43 PM  C-SSRS RISK CATEGORY No Risk No Risk No Risk       Have you Recently Had Thoughts About Hurting Someone Karolee Ohs? No  Are You Planning to Harm Someone at This Time? No  Explanation: No data recorded  Have You Used Any Alcohol or Drugs in the Past 24 Hours? No  What Did You Use and How Much? No data recorded  Do You Currently Have a Therapist/Psychiatrist? Yes  Name of Therapist/Psychiatrist: Name of Therapist/Psychiatrist: Dr. Marciano Sequin. Kapur   Have You Been Recently Discharged From Any Office Practice or Programs? No  Explanation of Discharge From Practice/Program: No data recorded    CCA Screening Triage Referral Assessment Type of Contact: Face-to-Face  Telemedicine Service Delivery:   Is this Initial or Reassessment?   Date Telepsych consult ordered in [facility]:    Time Telepsych consult ordered in [facility]:    Location of Assessment: [facility] ED  Provider Location: [facility] ED   Collateral Involvement: No data recorded  Does Patient Have a Court Appointed Legal Guardian? No  Legal Guardian Contact Information: No data recorded Copy of Legal Guardianship Form: No data recorded Legal Guardian Notified of Arrival: No data recorded Legal Guardian Notified of Pending Discharge: No data recorded If Minor and Not Living with Parent(s), Who has Custody? No data recorded Is CPS involved or ever been involved?  Never  Is APS involved or ever been involved? Never   Patient Determined To Be At Risk for Harm To Self or Others Based on Review of Patient Reported Information or Presenting Complaint? No  Method: No data recorded Availability of Means: No data recorded Intent: No data recorded Notification Required: No data recorded Additional Information for Danger to Others Potential: No data recorded Additional Comments for Danger to Others Potential: No data recorded Are There Guns or Other Weapons in Your Home? No  Types of Guns/Weapons: No data recorded Are These Weapons Safely Secured?                            No data recorded Who Could Verify You Are Able To Have These Secured: No data recorded Do You Have any Outstanding Charges, Pending Court Dates, Parole/Probation? No data recorded Contacted To Inform of Risk of Harm To Self or Others: No data recorded   Does Patient Present under Involuntary Commitment? No  [place] of Residence: [place]   Patient Currently Receiving the Following Services: No data recorded  Determination of Need: Emergent (2 hours)   Options For Referral: ED Visit; Inpatient Hospitalization     CCA Biopsychosocial Patient Reported Schizophrenia/Schizoaffective Diagnosis in Past: No   Strengths: Some insight, have a support system, and stable housing.   Mental Health Symptoms Depression:   Difficulty Concentrating; Worthlessness; Tearfulness; Change in energy/activity   Duration of Depressive symptoms:  Duration of Depressive Symptoms: Greater than two weeks   Mania:   N/A   Anxiety:    Worrying; Sleep; Restlessness; Difficulty concentrating; Fatigue   Psychosis:   None   Duration of Psychotic symptoms:    Trauma:   N/A   Obsessions:   N/A   Compulsions:   N/A   Inattention:   N/A   Hyperactivity/Impulsivity:   N/A   Oppositional/Defiant Behaviors:   N/A   Emotional Irregularity:   N/A   Other Mood/Personality  Symptoms:  No data recorded   Mental Status Exam Appearance and self-care  Stature:   Average   Weight:   Average weight   Clothing:   Neat/clean; Age-appropriate   Grooming:   Normal   Cosmetic use:   None   Posture/gait:   Normal   Motor activity:   -- (Within normal range)   Sensorium  Attention:   Normal   Concentration:   Normal   Orientation:   X5   Recall/memory:   Normal   Affect and Mood  Affect:   Appropriate; Full Range   Mood:   Anxious; Depressed   Relating  Eye contact:   Normal   Facial expression:   Anxious; Depressed   Attitude toward examiner:   Cooperative   Thought and Language  Speech flow:  Clear and Coherent   Thought content:   Appropriate to Mood and Circumstances   Preoccupation:   Obsessions   Hallucinations:   None   Organization:   Coherent; Intact   [employer] of Knowledge:   Fair   Intelligence:   Average   Abstraction:   Functional   Judgement:   Fair  Reality Testing:   Adequate   Insight:   Fair   Decision Making:   Impulsive   Social Functioning  Social Maturity:   Isolates   Social Judgement:   Normal   Stress  Stressors:   Transitions; Other (Comment) (Stop taking medications)   Coping Ability:   Exhausted   Skill Deficits:  No data recorded  Supports:   Family     Religion: Religion/Spirituality Are You A Religious Person?: Yes What is Your Religious Affiliation?: Christian  Leisure/Recreation: Leisure / Recreation Do You Have Hobbies?: No  Exercise/Diet: Exercise/Diet Do You Exercise?: No Have You Gained or Lost A Significant Amount of Weight in the Past Six Months?: No Do You Follow a Special Diet?: No Do You Have Any Trouble Sleeping?: Yes Explanation of Sleeping Difficulties: Trouble falling and staying asleep.   CCA Employment/Education Employment/Work Situation: Employment / Work Systems developer: Retired Has  Patient ever Been in Equities trader?: No  Education: Education Is Patient Currently Attending School?: No Did You Have An Individualized Education Program (IIEP): No Did You Have Any Difficulty At [employer]?: No Patient's Education Has Been Impacted by Current Illness: No   CCA Family/Childhood History Family and Relationship History: Family history Marital status: Married Does patient have children?: Yes  Childhood History:  Childhood History By whom was/is the patient raised?: Both parents Did patient suffer any verbal/emotional/physical/sexual abuse as a child?: No Did patient suffer from severe childhood neglect?: No Has patient ever been sexually abused/assaulted/raped as an adolescent or adult?: No Was the patient ever a victim of a crime or a disaster?: No Witnessed domestic violence?: No Has patient been affected by domestic violence as an adult?: No   CCA Substance Use Alcohol/Drug Use: Alcohol / Drug Use Pain Medications: See PTA Prescriptions: See PTA Over the Counter: See PTA History of alcohol / drug use?: No history of alcohol / drug abuse Longest period of sobriety (when/how long): n/a     ASAM's:  Six Dimensions of Multidimensional Assessment  Dimension 1:  Acute Intoxication and/or Withdrawal Potential:      Dimension 2:  Biomedical Conditions and Complications:      Dimension 3:  Emotional, Behavioral, or Cognitive Conditions and Complications:     Dimension 4:  Readiness to Change:     Dimension 5:  Relapse, Continued use, or Continued Problem Potential:     Dimension 6:  Recovery/Living Environment:     ASAM Severity Score:    ASAM Recommended Level of Treatment:     Substance use Disorder (SUD)    Recommendations for Services/Supports/Treatments:    Discharge Disposition:    DSM5 Diagnoses: Patient Active Problem List   Diagnosis Date Noted   Anxiety state 03/23/2023    Referrals to Alternative Service(s): Referred to Alternative  Service(s):   Place:   Date:   Time:    Referred to Alternative Service(s):   Place:   Date:   Time:    Referred to Alternative Service(s):   Place:   Date:   Time:    Referred to Alternative Service(s):   Place:   Date:   Time:     Lilyan Gilford MS, LCAS, [facility], [facility] Therapeutic Triage Specialist 03/23/2023 3:19 PM

## 2023-03-23 NOTE — BH Assessment (Signed)
Patient is to be admitted to [facility] by Psychiatric Nurse Practitioner  Gabriel Cirri .  Attending Physician will be Dr.  Toni Amend .   Patient has been assigned to room L27, by [facility] Charge Nurse Amy B.  Intake Paper Work has been signed and placed on patient chart.  ER staff is aware of the admission: Donnie Coffin., ER Secretary   Dr. Cleda Clarks, ER MD  Gaynell Face., Patient's Nurse

## 2023-03-23 NOTE — ED Triage Notes (Addendum)
Pt states that this her 3rd visit in 24 hours, states that she is feeling light headed, feeling anxious, and nauseated, pt states that she had stopped her prozac and then started it back today and started feeling bad after she took it  Pt is also hoping to have a psych eval to see what's going on, denies suicidal thoughts

## 2023-03-24 DIAGNOSIS — F333 Major depressive disorder, recurrent, severe with psychotic symptoms: Secondary | ICD-10-CM | POA: Diagnosis not present

## 2023-03-24 MED ORDER — ASPIRIN 81 MG PO TBEC
81.0000 mg | DELAYED_RELEASE_TABLET | Freq: Every day | ORAL | Status: DC
  Administered 2023-03-24 – 2023-03-30 (×7): 81 mg via ORAL
  Filled 2023-03-24 (×6): qty 1

## 2023-03-24 MED ORDER — SIMVASTATIN 20 MG PO TABS
20.0000 mg | ORAL_TABLET | Freq: Every day | ORAL | Status: DC
  Administered 2023-03-24 – 2023-03-30 (×7): 20 mg via ORAL
  Filled 2023-03-24 (×6): qty 1

## 2023-03-24 NOTE — H&P (Signed)
Psychiatric Admission Assessment Adult  Patient Identification: Miranda Delgado MRN:  [id number] Date of Evaluation:  03/24/2023 Chief Complaint:  MDD (major depressive disorder) [F32.9] Principal Diagnosis: MDD (major depressive disorder) Diagnosis:  Principal Problem:   MDD (major depressive disorder)  History of Present Illness: Miranda Delgado is a very pleasant 72 year old white female who was voluntarily admitted to inpatient psychiatry for worsening depression and anxiety.  She is a patient of Dr. Maryruth Bun and informs me that she stopped her Prozac about 2 months ago because she was feeling better.  She has 1 previous psychiatric admission in [place] about 2 years ago when she was having auditory hallucinations and was placed on Risperdal which she says was helpful.  She denies any auditory hallucinations or suicidal ideation at this time.  Her Prozac and Risperdal have been restarted.  I spoke to her about trazodone if she needs something for sleep.  NP ER PSYCH. EVAL:  Patient presents to the ED for the third time in less than 24 hours with complaints of anxiety.  Patient had made an appointment with her outpatient psychiatrist, Dr. Ignatius Specking at 2 PM today; however, patient states that she got so anxious at home she could not wait and came back to the ED.  She is requesting to be hospitalized for her anxiety state.   On evaluation, patient is lying on stretcher with her feet in constant movement.  Husband at bedside.  She is appears anxious, but is alert and oriented x 4.  She states that she is so anxious that she cannot go home.  She does not disclose any particular reason for being anxious, although her husband has had some medical problems over the last few months.  Patient denies acute suicidal ideation, but states that "I do not care if I live or die."  She says she just wants this anxiety to stop.  Patient reports that she  stopped taking prescribed Prozac about a year ago.  She says she does  not know why she stopped but she was feeling better and stopped it.  She reports that she was hospitalized about 3 years ago with depression and anxiety, including some paranoid delusions.  Patient reports that she was prescribed Risperdal at that time but she was taken off of it at discharge.  Reports that in the last week she did have a delusion when she went to pick up her granddaughter and she was not ready.  Patient says that she thought somebody told the granddaughter not to get ready.  She reports that she has not been sleeping well over the past several weeks.  She describes difficulty getting and staying asleep.  She reports normal appetite.  Patient denies homicidal thoughts or intent.  Denies auditory or visual hallucinations.  Denies acute suicidal ideation, but relates to lack of motivation for life due to what she describes as constant anxiety.   This Clinical research associate contacted patient's outpatient provider, Dr. Caryn Section, via secure chat.  Dr. Maryruth Bun confirmed medications that she has currently prescribed for patient and recommended that they be restarted.    This Clinical research associate restarted Prozac and Risperdal.  Will recommend inpatient psychiatric hospitalization with reassessment tomorrow if patient is not offered a bed in the meantime.  Patient and husband are in agreement. Associated Signs/Symptoms: Depression Symptoms:  insomnia, anxiety, (Hypo) Manic Symptoms:   Unremarkable Anxiety Symptoms:  Excessive Worry, Panic Symptoms, Social Anxiety, Psychotic Symptoms:   None PTSD Symptoms: NA Total Time spent with patient: 1 hour  Past Psychiatric History: She is currently being followed by Dr. Maryruth Bun.  She tells me that she has 1 previous psychiatric admission 2 years ago in [place] for auditory hallucinations and depression.  She has a history of depression and anxiety.  No past suicide attempts.  She has been on Zyprexa, Lexapro and Ativan in the past.  Is the patient at risk to self? Yes.     Has the patient been a risk to self in the past 6 months? Yes.    Has the patient been a risk to self within the distant past? Yes.    Is the patient a risk to others? No.  Has the patient been a risk to others in the past 6 months? No.  Has the patient been a risk to others within the distant past? No.   [place] Scale:  Flowsheet Row Admission (Current) from 03/23/2023 in [facility] BEHAVIORAL MEDICINE Most recent reading at 03/23/2023  6:51 PM ED from 03/23/2023 in [facility] Emergency Department at [facility] Most recent reading at 03/23/2023 11:13 AM ED from 03/23/2023 in [facility] Emergency Department at [facility] Most recent reading at 03/23/2023  4:27 AM  C-SSRS RISK CATEGORY No Risk No Risk No Risk        Prior Inpatient Therapy: Yes.    Prior Outpatient Therapy: Yes.     Alcohol Screening: 1. How often do you have a drink containing alcohol?: Never 2. How many drinks containing alcohol do you have on a typical day when you are drinking?: 1 or 2 3. How often do you have six or more drinks on one occasion?: Never AUDIT-C Score: 0 4. How often during the last year have you found that you were not able to stop drinking once you had started?: Never 5. How often during the last year have you failed to do what was normally expected from you because of drinking?: Never 6. How often during the last year have you needed a first drink in the morning to get yourself going after a heavy drinking session?: Never 7. How often during the last year have you had a feeling of guilt of remorse after drinking?: Never 8. How often during the last year have you been unable to remember what happened the night before because you had been drinking?: Never 9. Have you or someone else been injured as a result of your drinking?: No 10. Has a relative or friend or a doctor or another health worker been concerned about your drinking or suggested you cut down?: No Alcohol Use Disorder  Identification Test Final Score (AUDIT): 0 Alcohol Brief Interventions/Follow-up: Alcohol education/Brief advice Substance Abuse History in the last 12 months:  No. Consequences of Substance Abuse: NA Previous Psychotropic Medications: Yes Psychological Evaluations: Yes Past Medical History:  Past Medical History:  Diagnosis Date   Diabetes mellitus without complication (HCC)    Hypertension     Past Surgical History:  Procedure Laterality Date   CESAREAN SECTION     COLONOSCOPY WITH PROPOFOL N/A 09/19/2022   Procedure: COLONOSCOPY WITH PROPOFOL;  Surgeon: Jaynie Collins, DO;  Location: [facility] ENDOSCOPY;  Service: Gastroenterology;  Laterality: N/A;   TONSILLECTOMY     Family History: History reviewed. No pertinent family history. Family Psychiatric  History: Unknown Tobacco Screening:  Social History   Tobacco Use  Smoking Status Never  Smokeless Tobacco Never    BH Tobacco Counseling     Are you interested in Tobacco Cessation Medications?  N/A, patient does  not use tobacco products Counseled patient on smoking cessation:  N/A, patient does not use tobacco products Reason Tobacco Screening Not Completed: No value filed.       Social History:  Social History   Substance and Sexual Activity  Alcohol Use Not Currently     Social History   Substance and Sexual Activity  Drug Use Not Currently    Additional Social History:                           Allergies:  No Known Allergies Lab Results:  Results for orders placed or performed during the hospital encounter of 03/23/23 (from the past 48 hour(s))  Basic metabolic panel     Status: Abnormal   Collection Time: 03/23/23 11:15 AM  Result Value Ref Range   Sodium 133 (L) 135 - 145 mmol/L   Potassium 3.7 3.5 - 5.1 mmol/L   Chloride 103 98 - 111 mmol/L   CO2 20 (L) 22 - 32 mmol/L   Glucose, Bld 181 (H) 70 - 99 mg/dL    Comment: Glucose reference range applies only to samples taken after fasting  for at least 8 hours.   BUN 12 8 - 23 mg/dL   Creatinine, Ser 1.61 0.44 - 1.00 mg/dL   Calcium 9.5 8.9 - 09.6 mg/dL   GFR, Estimated >04 >54 mL/min    Comment: (NOTE) Calculated using the CKD-EPI Creatinine Equation (2021)    Anion gap 10 5 - 15    Comment: Performed at [facility], [address] [place], [place] [address]  CBC     Status: Abnormal   Collection Time: 03/23/23 11:15 AM  Result Value Ref Range   WBC 8.0 4.0 - 10.5 K/uL   RBC 3.86 (L) 3.87 - 5.11 MIL/uL   Hemoglobin 11.1 (L) 12.0 - 15.0 g/dL   HCT 91.4 (L) 78.2 - 95.6 %   MCV 87.0 80.0 - 100.0 fL   MCH 28.8 26.0 - 34.0 pg   MCHC 33.0 30.0 - 36.0 g/dL   RDW 21.3 08.6 - 57.8 %   Platelets 233 150 - 400 K/uL   nRBC 0.0 0.0 - 0.2 %    Comment: Performed at [facility], [address] [place], [place] [address]  Urinalysis, Routine w reflex microscopic -Urine, Clean Catch     Status: Abnormal   Collection Time: 03/23/23  2:35 PM  Result Value Ref Range   Color, Urine YELLOW (A) YELLOW   APPearance CLEAR (A) CLEAR   Specific Gravity, Urine 1.010 1.005 - 1.030   pH 5.0 5.0 - 8.0   Glucose, UA 50 (A) NEGATIVE mg/dL   Hgb urine dipstick NEGATIVE NEGATIVE   Bilirubin Urine NEGATIVE NEGATIVE   Ketones, ur 5 (A) NEGATIVE mg/dL   Protein, ur NEGATIVE NEGATIVE mg/dL   Nitrite NEGATIVE NEGATIVE   Leukocytes,Ua TRACE (A) NEGATIVE   RBC / HPF 0-5 0 - 5 RBC/hpf   WBC, UA 0-5 0 - 5 WBC/hpf   Bacteria, UA RARE (A) NONE SEEN   Squamous Epithelial / HPF 0-5 0 - 5 /HPF   Mucus PRESENT     Comment: Performed at [facility], [address] [place], [place] [address]    Blood Alcohol level:  Lab Results  Component Value Date   [facility] <5 07/15/2017    Metabolic Disorder Labs:  No results found for: "HGBA1C", "MPG" No results found for: "PROLACTIN" No results found for: "CHOL", "TRIG", "  HDL", "CHOLHDL", "VLDL", "LDLCALC"  Current Medications: Current Facility-Administered Medications   Medication Dose Route Frequency Provider Last Rate Last Admin   acetaminophen (TYLENOL) tablet 650 mg  650 mg Oral Q6H PRN Vanetta Mulders, NP       alum & mag hydroxide-simeth (MAALOX/MYLANTA) 200-200-20 MG/5ML suspension 30 mL  30 mL Oral Q4H PRN Vanetta Mulders, NP       amLODipine (NORVASC) tablet 10 mg  10 mg Oral Daily Gabriel Cirri F, NP       aspirin EC tablet 81 mg  81 mg Oral Daily Gabriel Cirri F, NP       cyanocobalamin (VITAMIN B12) tablet 1,000 mcg  1,000 mcg Oral Daily Gabriel Cirri F, NP   1,000 mcg at 03/24/23 1610   diphenhydrAMINE (BENADRYL) capsule 50 mg  50 mg Oral TID PRN Vanetta Mulders, NP       Or   diphenhydrAMINE (BENADRYL) injection 50 mg  50 mg Intramuscular TID PRN Vanetta Mulders, NP       FLUoxetine (PROZAC) capsule 20 mg  20 mg Oral q morning Gabriel Cirri F, NP   20 mg at 03/24/23 9604   haloperidol (HALDOL) tablet 5 mg  5 mg Oral TID PRN Vanetta Mulders, NP       Or   haloperidol lactate (HALDOL) injection 5 mg  5 mg Intramuscular TID PRN Vanetta Mulders, NP       iron polysaccharides (NIFEREX) capsule 150 mg  150 mg Oral Daily Gabriel Cirri F, NP   150 mg at 03/24/23 5409   lisinopril (ZESTRIL) tablet 40 mg  40 mg Oral Daily Gabriel Cirri F, NP       LORazepam (ATIVAN) tablet 2 mg  2 mg Oral TID PRN Vanetta Mulders, NP       Or   LORazepam (ATIVAN) injection 2 mg  2 mg Intramuscular TID PRN Vanetta Mulders, NP       magnesium hydroxide (MILK OF MAGNESIA) suspension 30 mL  30 mL Oral Daily PRN Gabriel Cirri F, NP       magnesium oxide (MAG-OX) tablet 400 mg  400 mg Oral Daily Gabriel Cirri F, NP   400 mg at 03/24/23 0853   metFORMIN (GLUCOPHAGE) tablet 500 mg  500 mg Oral BID WC Gabriel Cirri F, NP   500 mg at 03/24/23 0853   risperiDONE (RISPERDAL) tablet 1 mg  1 mg Oral QHS Gabriel Cirri F, NP   1 mg at 03/23/23 2128   [START ON 03/26/2023] Semaglutide(0.25 or 0.5MG /DOS) SOPN 0.25 mg  0.25 mg Subcutaneous  Weekly Sharen Hones, RPH       simvastatin (ZOCOR) tablet 20 mg  20 mg Oral Daily Gabriel Cirri F, NP       PTA Medications: Medications Prior to Admission  Medication Sig Dispense Refill Last Dose   amLODipine (NORVASC) 10 MG tablet Take 10 mg by mouth daily.      aspirin EC 81 MG tablet Take 81 mg by mouth daily.      cyanocobalamin (VITAMIN B12) 1000 MCG tablet Take 1,000 mcg by mouth daily.      FERREX 150 150 MG capsule Take 150 mg by mouth daily.      FLUoxetine (PROZAC) 20 MG capsule Take 20 mg by mouth daily.      HUMALOG KWIKPEN 100 UNIT/ML KiwkPen Inject 10 Units into the skin daily. Take with largest meal of the day. (Patient not taking: Reported on 09/19/2022)  LEVEMIR FLEXTOUCH 100 UNIT/ML Pen Inject 50 Units into the skin at bedtime. (Patient not taking: Reported on 09/19/2022)      lisinopril (PRINIVIL,ZESTRIL) 30 MG tablet Take 30 mg by mouth daily.      magnesium oxide (MAG-OX) 400 (240 Mg) MG tablet Take 400 mg by mouth daily.      metFORMIN (GLUCOPHAGE) 500 MG tablet Take 500 mg by mouth 2 (two) times daily.      metoprolol tartrate (LOPRESSOR) 25 MG tablet Take 1 tablet (25 mg total) by mouth 2 (two) times daily for 15 days. 30 tablet 0    omeprazole (PRILOSEC) 20 MG capsule Take 20 mg by mouth daily. (Patient not taking: Reported on 09/19/2022)      SEMAGLUTIDE,0.25 OR 0.5MG /DOS, [place] Inject into the skin.      simvastatin (ZOCOR) 20 MG tablet Take 20 mg by mouth daily.       Musculoskeletal: Strength & Muscle Tone: within normal limits Gait & Station: normal Patient leans: N/A            Psychiatric Specialty Exam:  Presentation  General Appearance:  Appropriate for Environment; Casual  Eye Contact: Good  Speech: Clear and Coherent  Speech Volume: Normal  Handedness: Right   Mood and Affect  Mood: Anxious  Affect: Depressed   Thought Process  Thought Processes: Coherent  Duration of Psychotic Symptoms:N/A Past  Diagnosis of Schizophrenia or Psychoactive disorder: No  Descriptions of Associations:Intact  Orientation:Full (Time, Place and Person)  Thought Content:WDL (History of some paranoid thoughts recently)  Hallucinations:Hallucinations: None  Ideas of Reference:Paranoia  Suicidal Thoughts:Suicidal Thoughts: No  Homicidal Thoughts:Homicidal Thoughts: No   Sensorium  Memory: Immediate Good  Judgment: Fair  Insight: Fair   Art therapist  Concentration: Fair  Attention Span: Fair  Recall: [employer] of Knowledge:No data recorded Language: Fair   Psychomotor Activity  Psychomotor Activity: Psychomotor Activity: Restlessness   Assets  Assets: Manufacturing systems engineer; Desire for Improvement; Financial Resources/Insurance; Housing; Social Support; Resilience; Physical Health   Sleep  Sleep: Sleep: Poor    Physical Exam: Physical Exam Constitutional:      Appearance: Normal appearance.  HENT:     Head: Normocephalic and atraumatic.     Mouth/Throat:     Pharynx: Oropharynx is clear.  Eyes:     Pupils: Pupils are equal, round, and reactive to light.  Cardiovascular:     Rate and Rhythm: Normal rate and regular rhythm.  Pulmonary:     Effort: Pulmonary effort is normal.     Breath sounds: Normal breath sounds.  Abdominal:     General: Abdomen is flat.     Palpations: Abdomen is soft.  Musculoskeletal:        General: Normal range of motion.  Skin:    General: Skin is warm and dry.  Neurological:     General: No focal deficit present.     Mental Status: She is alert. Mental status is at baseline.  Psychiatric:        Attention and Perception: Attention and perception normal.        Mood and Affect: Mood is anxious and depressed. Affect is flat.        Speech: Speech normal.        Behavior: Behavior normal. Behavior is cooperative.        Thought Content: Thought content normal.        Cognition and Memory: Cognition and memory normal.         Judgment: Judgment  normal.    Review of Systems  Constitutional: Negative.   HENT: Negative.    Eyes: Negative.   Respiratory: Negative.    Cardiovascular: Negative.   Gastrointestinal: Negative.   Genitourinary: Negative.   Musculoskeletal: Negative.   Skin: Negative.   Neurological: Negative.   Endo/Heme/Allergies: Negative.   Psychiatric/Behavioral:  Positive for depression. The patient is nervous/anxious.    Blood pressure (!) 120/51, pulse 83, temperature 97.9 F (36.6 C), resp. rate 15, height 5\' 3"  (1.6 m), weight 61.2 kg, SpO2 100 %. Body mass index is 23.91 kg/m.  Treatment Plan Summary: Daily contact with patient to assess and evaluate symptoms and progress in treatment, Medication management, and Plan restart Prozac and Risperdal  Observation Level/Precautions:  15 minute checks  Laboratory:  CBC Chemistry Profile  Psychotherapy:    Medications:    Consultations:    Discharge Concerns:    Estimated LOS:  Other:     Physician Treatment Plan for Primary Diagnosis: MDD (major depressive disorder) Long Term Goal(s): Improvement in symptoms so as ready for discharge  Short Term Goals: Ability to identify changes in lifestyle to reduce recurrence of condition will improve, Ability to verbalize feelings will improve, Ability to disclose and discuss suicidal ideas, Ability to demonstrate self-control will improve, Ability to identify and develop effective coping behaviors will improve, Ability to maintain clinical measurements within normal limits will improve, Compliance with prescribed medications will improve, and Ability to identify triggers associated with substance abuse/mental health issues will improve  Physician Treatment Plan for Secondary Diagnosis: Principal Problem:   MDD (major depressive disorder)   I certify that inpatient services furnished can reasonably be expected to improve the patient's condition.    Sarina Ill, DO 5/31/202410:55  AM

## 2023-03-24 NOTE — Progress Notes (Signed)
   03/24/23 0020  Psych Admission Type (Psych Patients Only)  Admission Status Voluntary  Psychosocial Assessment  Patient Complaints Anxiety  Eye Contact Fair  Facial Expression Anxious  Affect UTA  Speech Logical/coherent  Interaction Assertive  Motor Activity Other (Comment) (steady)  Appearance/Hygiene Unremarkable  Behavior Characteristics Cooperative;Appropriate to situation  Mood Labile  Thought Process  Coherency WDL  Content WDL  Delusions None reported or observed  Perception WDL  Hallucination None reported or observed  Judgment WDL  Confusion None  Danger to Self  Current suicidal ideation? Denies  Danger to Others  Danger to Others None reported or observed

## 2023-03-24 NOTE — BH IP Treatment Plan (Signed)
Interdisciplinary Treatment and Diagnostic Plan Update  03/24/2023 Time of Session: 9:57 AM Miranda Delgado MRN: [id number]  Principal Diagnosis: MDD (major depressive disorder)  Secondary Diagnoses: Principal Problem:   MDD (major depressive disorder)   Current Medications:  Current Facility-Administered Medications  Medication Dose Route Frequency Provider Last Rate Last Admin   acetaminophen (TYLENOL) tablet 650 mg  650 mg Oral Q6H PRN Vanetta Mulders, NP       alum & mag hydroxide-simeth (MAALOX/MYLANTA) 200-200-20 MG/5ML suspension 30 mL  30 mL Oral Q4H PRN Vanetta Mulders, NP       amLODipine (NORVASC) tablet 10 mg  10 mg Oral Daily Gabriel Cirri F, NP       aspirin EC tablet 81 mg  81 mg Oral Daily Gabriel Cirri F, NP       cyanocobalamin (VITAMIN B12) tablet 1,000 mcg  1,000 mcg Oral Daily Gabriel Cirri F, NP   1,000 mcg at 03/24/23 4098   diphenhydrAMINE (BENADRYL) capsule 50 mg  50 mg Oral TID PRN Vanetta Mulders, NP       Or   diphenhydrAMINE (BENADRYL) injection 50 mg  50 mg Intramuscular TID PRN Vanetta Mulders, NP       FLUoxetine (PROZAC) capsule 20 mg  20 mg Oral q morning Gabriel Cirri F, NP   20 mg at 03/24/23 1191   haloperidol (HALDOL) tablet 5 mg  5 mg Oral TID PRN Vanetta Mulders, NP       Or   haloperidol lactate (HALDOL) injection 5 mg  5 mg Intramuscular TID PRN Vanetta Mulders, NP       iron polysaccharides (NIFEREX) capsule 150 mg  150 mg Oral Daily Gabriel Cirri F, NP   150 mg at 03/24/23 4782   lisinopril (ZESTRIL) tablet 40 mg  40 mg Oral Daily Gabriel Cirri F, NP       LORazepam (ATIVAN) tablet 2 mg  2 mg Oral TID PRN Vanetta Mulders, NP       Or   LORazepam (ATIVAN) injection 2 mg  2 mg Intramuscular TID PRN Vanetta Mulders, NP       magnesium hydroxide (MILK OF MAGNESIA) suspension 30 mL  30 mL Oral Daily PRN Gabriel Cirri F, NP       magnesium oxide (MAG-OX) tablet 400 mg  400 mg Oral Daily Gabriel Cirri F, NP   400 mg at 03/24/23 0853   metFORMIN (GLUCOPHAGE) tablet 500 mg  500 mg Oral BID WC Gabriel Cirri F, NP   500 mg at 03/24/23 0853   risperiDONE (RISPERDAL) tablet 1 mg  1 mg Oral QHS Gabriel Cirri F, NP   1 mg at 03/23/23 2128   [START ON 03/26/2023] Semaglutide(0.25 or 0.5MG /DOS) SOPN 0.25 mg  0.25 mg Subcutaneous Weekly Sharen Hones, RPH       simvastatin (ZOCOR) tablet 20 mg  20 mg Oral Daily Gabriel Cirri F, NP       PTA Medications: Medications Prior to Admission  Medication Sig Dispense Refill Last Dose   amLODipine (NORVASC) 10 MG tablet Take 10 mg by mouth daily.      aspirin EC 81 MG tablet Take 81 mg by mouth daily.      cyanocobalamin (VITAMIN B12) 1000 MCG tablet Take 1,000 mcg by mouth daily.      FERREX 150 150 MG capsule Take 150 mg by mouth daily.      FLUoxetine (PROZAC) 20 MG capsule Take 20 mg by  mouth daily.      HUMALOG KWIKPEN 100 UNIT/ML KiwkPen Inject 10 Units into the skin daily. Take with largest meal of the day. (Patient not taking: Reported on 09/19/2022)      LEVEMIR FLEXTOUCH 100 UNIT/ML Pen Inject 50 Units into the skin at bedtime. (Patient not taking: Reported on 09/19/2022)      lisinopril (PRINIVIL,ZESTRIL) 30 MG tablet Take 30 mg by mouth daily.      magnesium oxide (MAG-OX) 400 (240 Mg) MG tablet Take 400 mg by mouth daily.      metFORMIN (GLUCOPHAGE) 500 MG tablet Take 500 mg by mouth 2 (two) times daily.      metoprolol tartrate (LOPRESSOR) 25 MG tablet Take 1 tablet (25 mg total) by mouth 2 (two) times daily for 15 days. 30 tablet 0    omeprazole (PRILOSEC) 20 MG capsule Take 20 mg by mouth daily. (Patient not taking: Reported on 09/19/2022)      SEMAGLUTIDE,0.25 OR 0.5MG /DOS, Flowella Inject into the skin.      simvastatin (ZOCOR) 20 MG tablet Take 20 mg by mouth daily.       Patient Stressors: Medication change or noncompliance    Patient Strengths: Ability for insight  Capable of independent living  Communication skills   Supportive family/friends   Treatment Modalities: Medication Management, Group therapy, Case management,  1 to 1 session with clinician, Psychoeducation, Recreational therapy.   Physician Treatment Plan for Primary Diagnosis: MDD (major depressive disorder) Long Term Goal(s): Improvement in symptoms so as ready for discharge   Short Term Goals: Ability to identify changes in lifestyle to reduce recurrence of condition will improve Ability to verbalize feelings will improve Ability to disclose and discuss suicidal ideas Ability to demonstrate self-control will improve Ability to identify and develop effective coping behaviors will improve Ability to maintain clinical measurements within normal limits will improve Compliance with prescribed medications will improve Ability to identify triggers associated with substance abuse/mental health issues will improve  Medication Management: Evaluate patient's response, side effects, and tolerance of medication regimen.  Therapeutic Interventions: 1 to 1 sessions, Unit Group sessions and Medication administration.  Evaluation of Outcomes: Not Met  Physician Treatment Plan for Secondary Diagnosis: Principal Problem:   MDD (major depressive disorder)  Long Term Goal(s): Improvement in symptoms so as ready for discharge   Short Term Goals: Ability to identify changes in lifestyle to reduce recurrence of condition will improve Ability to verbalize feelings will improve Ability to disclose and discuss suicidal ideas Ability to demonstrate self-control will improve Ability to identify and develop effective coping behaviors will improve Ability to maintain clinical measurements within normal limits will improve Compliance with prescribed medications will improve Ability to identify triggers associated with substance abuse/mental health issues will improve     Medication Management: Evaluate patient's response, side effects, and tolerance of  medication regimen.  Therapeutic Interventions: 1 to 1 sessions, Unit Group sessions and Medication administration.  Evaluation of Outcomes: Not Met   RN Treatment Plan for Primary Diagnosis: MDD (major depressive disorder) Long Term Goal(s): Knowledge of disease and therapeutic regimen to maintain health will improve  Short Term Goals: Ability to demonstrate self-control, Ability to participate in decision making will improve, Ability to verbalize feelings will improve, Ability to disclose and discuss suicidal ideas, Ability to identify and develop effective coping behaviors will improve, and Compliance with prescribed medications will improve  Medication Management: RN will administer medications as ordered by provider, will assess and evaluate patient's response and provide education  to patient for prescribed medication. RN will report any adverse and/or side effects to prescribing provider.  Therapeutic Interventions: 1 on 1 counseling sessions, Psychoeducation, Medication administration, Evaluate responses to treatment, Monitor vital signs and CBGs as ordered, Perform/monitor CIWA, COWS, AIMS and Fall Risk screenings as ordered, Perform wound care treatments as ordered.  Evaluation of Outcomes: Not Met   LCSW Treatment Plan for Primary Diagnosis: MDD (major depressive disorder) Long Term Goal(s): Safe transition to appropriate next level of care at discharge, Engage patient in therapeutic group addressing interpersonal concerns.  Short Term Goals: Engage patient in aftercare planning with referrals and resources, Increase social support, Increase ability to appropriately verbalize feelings, Increase emotional regulation, Facilitate acceptance of mental health diagnosis and concerns, and Increase skills for wellness and recovery  Therapeutic Interventions: Assess for all discharge needs, 1 to 1 time with Social worker, Explore available resources and support systems, Assess for adequacy  in community support network, Educate family and significant other(s) on suicide prevention, Complete Psychosocial Assessment, Interpersonal group therapy.  Evaluation of Outcomes: Not Met   Progress in Treatment: Attending groups: Yes. Participating in groups: Yes. Taking medication as prescribed: Yes. Toleration medication: Yes. Family/Significant other contact made: Yes, individual(s) contacted:  SPE completed with the patient's husband. Patient understands diagnosis: Yes. Discussing patient identified problems/goals with staff: Yes. Medical problems stabilized or resolved: Yes. Denies suicidal/homicidal ideation: Yes. Issues/concerns per patient self-inventory: No. Other: none  New problem(s) identified: No, Describe:  none  New Short Term/Long Term Goal(s): elimination of symptoms of psychosis, medication management for mood stabilization; elimination of SI thoughts; development of comprehensive mental wellness/sobriety plan.   Patient Goals:  "Get better.  Not regress back to where I am now.  Move forward and keep moving forward."  Discharge Plan or Barriers:  CSW to assist with appropriate discharge plans.   Reason for Continuation of Hospitalization: Anxiety Delusions  Depression Medication stabilization Suicidal ideation  Estimated Length of Stay:  1-7 days  Last 3 [place] Suicide Severity Risk Score: Flowsheet Row Admission (Current) from 03/23/2023 in [facility] Most recent reading at 03/23/2023  6:51 PM ED from 03/23/2023 in [facility] Emergency Department at [facility] Most recent reading at 03/23/2023 11:13 AM ED from 03/23/2023 in [facility] Emergency Department at [facility] Most recent reading at 03/23/2023  4:27 AM  C-SSRS RISK CATEGORY No Risk No Risk No Risk       Last PHQ 2/9 Scores:     No data to display          Scribe for Treatment Team: Harden Mo, LCSW 03/24/2023 12:04 PM

## 2023-03-24 NOTE — Group Note (Signed)
Date:  03/24/2023 Time:  10:07 PM  Group Topic/Focus:  Building Self Esteem:   The Focus of this group is helping patients become aware of the effects of self-esteem on their lives, the things they and others do that enhance or undermine their self-esteem, seeing the relationship between their level of self-esteem and the choices they make and learning ways to enhance self-esteem. Coping With Mental Health Crisis:   The purpose of this group is to help patients identify strategies for coping with mental health crisis.  Group discusses possible causes of crisis and ways to manage them effectively. Conflict Resolution:   The focus of this group is to discuss the conflict resolution process and how it may be used upon discharge. Developing a Wellness Toolbox:   The focus of this group is to help patients develop a "wellness toolbox" with skills and strategies to promote recovery upon discharge. Early Warning Signs:   The focus of this group is to help patients identify signs or symptoms they exhibit before slipping into an unhealthy state or crisis. Emotional Education:   The focus of this group is to discuss what feelings/emotions are, and how they are experienced. Goals Group:   The focus of this group is to help patients establish daily goals to achieve during treatment and discuss how the patient can incorporate goal setting into their daily lives to aide in recovery. Healthy Communication:   The focus of this group is to discuss communication, barriers to communication, as well as healthy ways to communicate with others. Identifying Needs:   The focus of this group is to help patients identify their personal needs that have been historically problematic and identify healthy behaviors to address their needs. Making Healthy Choices:   The focus of this group is to help patients identify negative/unhealthy choices they were using prior to admission and identify positive/healthier coping strategies to  replace them upon discharge. Managing Feelings:   The focus of this group is to identify what feelings patients have difficulty handling and develop a plan to handle them in a healthier way upon discharge. Overcoming Stress:   The focus of this group is to define stress and help patients assess their triggers. Personal Choices and Values:   The focus of this group is to help patients assess and explore the importance of values in their lives, how their values affect their decisions, how they express their values and what opposes their expression. Rediscovering Joy:   The focus of this group is to explore various ways to relieve stress in a positive manner.    Participation Level:  Active  Participation Quality:  Appropriate and Supportive  Affect:  Appropriate  Cognitive:  Alert and Appropriate  Insight: Appropriate and Good  Engagement in Group:  Developing/Improving, Engaged, and Improving  Modes of Intervention:  Activity and Support  Additional Comments:    Miranda Delgado 03/24/2023, 10:07 PM

## 2023-03-24 NOTE — Progress Notes (Signed)
Daily BP medications given with evening medications.  BP 141/62.

## 2023-03-24 NOTE — Plan of Care (Signed)
AAOX3. Pleasant and cooperative. Visible on the unit. Visited by spouse. Attends group. Interacting with peers and staff. Po medications given as scheduled. Tol well. No PRNs given. No behavior issues noted. Denies SI/HI/AV/H. No c/o pain/discomfort noted. Plan of care continued.  Problem: Activity: Goal: Risk for activity intolerance will decrease Outcome: Progressing   Problem: Nutrition: Goal: Adequate nutrition will be maintained Outcome: Progressing   Problem: Coping: Goal: Level of anxiety will decrease Outcome: Progressing   Problem: Elimination: Goal: Will not experience complications related to bowel motility Outcome: Progressing   Problem: Pain Managment: Goal: General experience of comfort will improve Outcome: Progressing   Problem: Safety: Goal: Ability to remain free from injury will improve Outcome: Progressing

## 2023-03-24 NOTE — Plan of Care (Signed)
New Goal as of 03/24/23   Problem: Coping Skills Goal: STG - Patient will identify 3 positive coping skills strategies to use for anxiety post d/c within 5 recreation therapy group sessions Description: STG - Patient will identify 3 positive coping skills strategies to use for anxiety post d/c within 5 recreation therapy group sessions Outcome: Not Applicable
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
## 2023-03-24 NOTE — Plan of Care (Signed)
  Problem: Education: Goal: Knowledge of General Education information will improve Description: Including pain rating scale, medication(s)/side effects and non-pharmacologic comfort measures Outcome: Progressing   Problem: Nutrition: Goal: Adequate nutrition will be maintained Outcome: Progressing   Problem: Coping: Goal: Level of anxiety will decrease Outcome: Progressing   

## 2023-03-24 NOTE — Progress Notes (Signed)
Patient cooperative and pleasant.  Reports she slept well last night and has a good appetite. Endorses anxiety.  Denies depression, SI, HI and AVH.  Patient is present in the milieu.  Appropriate interaction with peers.  Compliant with scheduled medications.  15 min checks in place for safety.

## 2023-03-24 NOTE — Group Note (Signed)
Recreation Therapy Group Note   Group Topic:Leisure Education  Group Date: 03/24/2023 Start Time: 1400 End Time: 1445 Facilitators: Rosina Lowenstein, LRT, CTRS Location: Courtyard  Group Description: Leisure. Patients were given the option to choose from playing corn hole, playing with a deck or cards or listening to music. LRT and pts discussed the meaning of leisure, the importance of participating in leisure during their free time/when they're outside of the hospital, as well as how our leisure interests can also serve as coping skills. Pt identified two leisure interests and shared with the group.   Goal Area(s) Addressed:  Patient will identify a current leisure interest.  Patient will learn the definition of "leisure". Patient will practice making a positive decision. Patient will have the opportunity to try a new leisure activity. Patient will communicate with peers and LRT.   Affect/Mood: Appropriate   Participation Level: Active and Engaged   Participation Quality: Independent   Behavior: Calm and Cooperative   Speech/Thought Process: Coherent   Insight: Good   Judgement: Good   Modes of Intervention: Activity   Patient Response to Interventions:  Attentive, Engaged, Interested , and Receptive   Education Outcome:  Acknowledges education   Clinical Observations/Individualized Feedback: Miranda Delgado was active in their participation of session activities and group discussion. Pt identified "spend time with my 5 grandchildren or go out to eat" as things that she does in her free time. Pt chose to listen to music and requested [employer]. Pt interacted well with LRT and peers duration of session.    Plan: Continue to engage patient in RT group sessions 2-3x/week. and Conduct Recreation Therapy Assessment interview within 72 hours.   Rosina Lowenstein, LRT, CTRS 03/24/2023 3:15 PM

## 2023-03-24 NOTE — BHH Group Notes (Signed)
B[facility] Group Notes:  (Nursing/MHT/Case Management/Adjunct)  Date:  03/24/2023  Time:  10:18 AM  Type of Therapy:   community meeting  Participation Level:  Active  Participation Quality:  Appropriate  Affect:  Appropriate  Cognitive:  Appropriate  Insight:  Appropriate  Engagement in Group:  Engaged  Modes of Intervention:  Discussion and Education  Summary of Progress/Problems:  Rodena Goldmann 03/24/2023, 10:18 AM

## 2023-03-24 NOTE — [facility] Suicide Risk Assessment (Signed)
[facility] Admission Suicide Risk Assessment   Nursing information obtained from:  Patient Demographic factors:  Age 72 or older, Caucasian Current Mental Status:  NA Loss Factors:  NA Historical Factors:  NA Risk Reduction Factors:  Positive coping skills or problem solving skills, Positive social support, Living with another person, especially a relative  Total Time spent with patient: 1 hour Principal Problem: MDD (major depressive disorder) Diagnosis:  Principal Problem:   MDD (major depressive disorder)  Subjective Data:  Patient presents to the ED for the third time in less than 24 hours with complaints of anxiety.  Patient had made an appointment with her outpatient psychiatrist, Dr. Ignatius Specking at 2 PM today; however, patient states that she got so anxious at home she could not wait and came back to the ED.  She is requesting to be hospitalized for her anxiety state.   On evaluation, patient is lying on stretcher with her feet in constant movement.  Husband at bedside.  She is appears anxious, but is alert and oriented x 4.  She states that she is so anxious that she cannot go home.  She does not disclose any particular reason for being anxious, although her husband has had some medical problems over the last few months.  Patient denies acute suicidal ideation, but states that "I do not care if I live or die."  She says she just wants this anxiety to stop.  Patient reports that she  stopped taking prescribed Prozac about a year ago.  She says she does not know why she stopped but she was feeling better and stopped it.  She reports that she was hospitalized about 3 years ago with depression and anxiety, including some paranoid delusions.  Patient reports that she was prescribed Risperdal at that time but she was taken off of it at discharge.  Reports that in the last week she did have a delusion when she went to pick up her granddaughter and she was not ready.  Patient says that she thought somebody  told the granddaughter not to get ready.  She reports that she has not been sleeping well over the past several weeks.  She describes difficulty getting and staying asleep.  She reports normal appetite.  Patient denies homicidal thoughts or intent.  Denies auditory or visual hallucinations.  Denies acute suicidal ideation, but relates to lack of motivation for life due to what she describes as constant anxiety.   This Clinical research associate contacted patient's outpatient provider, Dr. Caryn Section, via secure chat.  Dr. Maryruth Bun confirmed medications that she has currently prescribed for patient and recommended that they be restarted.    This Clinical research associate restarted Prozac and Risperdal.  Will recommend inpatient psychiatric hospitalization with reassessment tomorrow if patient is not offered a bed in the meantime.  Patient and husband are in agreement.  Continued Clinical Symptoms:  Alcohol Use Disorder Identification Test Final Score (AUDIT): 0 The "Alcohol Use Disorders Identification Test", Guidelines for Use in Primary Care, Second Edition.  World Science writer [facility]). Score between 0-7:  no or low risk or alcohol related problems. Score between 8-15:  moderate risk of alcohol related problems. Score between 16-19:  high risk of alcohol related problems. Score 20 or above:  warrants further diagnostic evaluation for alcohol dependence and treatment.   CLINICAL FACTORS:   Panic Attacks   Musculoskeletal: Strength & Muscle Tone: within normal limits Gait & Station: normal Patient leans: N/A  Psychiatric Specialty Exam:  Presentation  General Appearance:  Appropriate  for Environment; Casual  Eye Contact: Good  Speech: Clear and Coherent  Speech Volume: Normal  Handedness: Right   Mood and Affect  Mood: Anxious  Affect: Depressed   Thought Process  Thought Processes: Coherent  Descriptions of Associations:Intact  Orientation:Full (Time, Place and Person)  Thought Content:WDL  (History of some paranoid thoughts recently)  History of Schizophrenia/Schizoaffective disorder:No  Duration of Psychotic Symptoms:No data recorded Hallucinations:Hallucinations: None  Ideas of Reference:Paranoia  Suicidal Thoughts:Suicidal Thoughts: No  Homicidal Thoughts:Homicidal Thoughts: No   Sensorium  Memory: Immediate Good  Judgment: Fair  Insight: Fair   Art therapist  Concentration: Fair  Attention Span: Fair  Recall: [employer] of Knowledge:No data recorded Language: Fair   Psychomotor Activity  Psychomotor Activity: Psychomotor Activity: Restlessness   Assets  Assets: Manufacturing systems engineer; Desire for Improvement; Financial Resources/Insurance; Housing; Social Support; Resilience; Physical Health   Sleep  Sleep: Sleep: Poor    Physical Exam: Physical Exam Constitutional:      Appearance: Normal appearance.  HENT:     Head: Normocephalic and atraumatic.     Mouth/Throat:     Pharynx: Oropharynx is clear.  Eyes:     Pupils: Pupils are equal, round, and reactive to light.  Cardiovascular:     Rate and Rhythm: Normal rate and regular rhythm.  Pulmonary:     Effort: Pulmonary effort is normal.     Breath sounds: Normal breath sounds.  Abdominal:     General: Abdomen is flat.     Palpations: Abdomen is soft.  Musculoskeletal:        General: Normal range of motion.  Skin:    General: Skin is warm and dry.  Neurological:     General: No focal deficit present.     Mental Status: She is alert. Mental status is at baseline.  Psychiatric:        Mood and Affect: Mood is anxious. Affect is flat.        Speech: Speech normal.        Behavior: Behavior is cooperative.        Thought Content: Thought content normal.        Cognition and Memory: Cognition and memory normal.        Judgment: Judgment normal.    ROS Blood pressure (!) 120/51, pulse 83, temperature 97.9 F (36.6 C), resp. rate 15, height 5\' 3"  (1.6 m), weight  61.2 kg, SpO2 100 %. Body mass index is 23.91 kg/m.   COGNITIVE FEATURES THAT CONTRIBUTE TO RISK:  None    SUICIDE RISK:   Minimal: No identifiable suicidal ideation.  Patients presenting with no risk factors but with morbid ruminations; may be classified as minimal risk based on the severity of the depressive symptoms  PLAN OF CARE: See Orders  I certify that inpatient services furnished can reasonably be expected to improve the patient's condition.   Sarina Ill, DO 03/24/2023, 10:53 AM

## 2023-03-24 NOTE — BH Assessment (Signed)
Recreation Therapy Notes  INPATIENT RECREATION THERAPY ASSESSMENT  Patient Details Name: Miranda Delgado MRN: [id number] DOB: [birth year] Today's Date: 03/24/2023       Information Obtained From: Patient (In addition to chart review)  Able to Participate in Assessment/Interview: Yes  Patient Presentation: Responsive, Alert, Oriented  Reason for Admission (Per Patient): Active Symptoms, Med Non-Compliance ("I had a bad anxiety attack. I stopped my prozac cold [place] a couple months ago.Marland KitchenMarland KitchenI felt good so I stopped taking it.")  Patient Stressors: Family ("My husband fell in December and has mad months of physical therapy. I was his caregiver for the first couple of months. Monday he had to go to the ER and have his appendix taken out.")  Coping Skills:   Intrusive Behavior ("I worry about everything")  Leisure Interests (2+):  Social - Family  Frequency of Recreation/Participation: Weekly  Awareness of Community Resources:  Yes  Community Resources:  Restaurants  Current Use: Yes  If no, Barriers?:    Expressed Interest in [employer] Information: No  County of Residence:  [place]  Patient Main Form of Transportation: Car  Patient Strengths:  "I use to be good at sports. I am a good caregiver.Marland KitchenMarland KitchenI took care of my mom that had dementia and my husband the past two times."  Patient Identified Areas of Improvement:  "Not to worry"  Patient Goal for Hospitalization:  "Get better, stay on my meds and go home."  Current SI (including self-harm):  No  Current HI:  No  Current AVH: No  Staff Intervention Plan: Group Attendance, Collaborate with Interdisciplinary Treatment Team  Consent to Intern Participation: N/A   Patient shared that she had an anxiety attack due to things going on at home and being off of her medicine. Pt shared that she was on Prozac, however came off of it abruptly because she said she was feeling better. Pt shared that her  husband came to the ED on Monday and needed his appendix taken out. Before that, he had fallen in December and needed months of physical therapy. Pt shared that she had been sleeping at the hospital most recently to be with her husband while he recovers from appendix surgery. Pt shared that she had not been sleeping well and was tired due to this. Pt shared that she enjoys spending time with her family and picks her 36 year old granddaughter up from school during the school year. Pt shared that she and her family also enjoy going on "day trips" to see different places. Pt shared that her husband is the Chiropodist of payroll at [employer] and that he is 72 years old. Pt provided insight and shared "I worry about everything all of the time, I need to stop and let them worry about themselves." Pt was pleasant and had a bright affect when interacting with her.   [address], CTRS Barney Gertsch E Tavian Callander 03/24/2023, 3:32 PM

## 2023-03-24 NOTE — [facility] Suicide Risk Assessment (Signed)
[facility] INPATIENT:  Family/Significant Other Suicide Prevention Education  Suicide Prevention Education:  Education Completed; Sumitra Deckert, husband, [phone] has been identified by the patient as the family member/significant other with whom the patient will be residing, and identified as the person(s) who will aid the patient in the event of a mental health crisis (suicidal ideations/suicide attempt).  With written consent from the patient, the family member/significant other has been provided the following suicide prevention education, prior to the and/or following the discharge of the patient.  The suicide prevention education provided includes the following: Suicide risk factors Suicide prevention and interventions National Suicide Hotline telephone number [facility] assessment telephone number [facility] Emergency Assistance 911 [facility] and/or Residential Mobile Crisis Unit telephone number  Request made of family/significant other to: Remove weapons (e.g., guns, rifles, knives), all items previously/currently identified as safety concern.   Remove drugs/medications (over-the-counter, prescriptions, illicit drugs), all items previously/currently identified as a safety concern.  The family member/significant other verbalizes understanding of the suicide prevention education information provided.  The family member/significant other agrees to remove the items of safety concern listed above.  Husband reports that patient is not a danger to herself or others.  He reports that she does not have access to weapons. He identifies that pt has been triggered by his health problems.  He reports that she also stopped taking her medications.    Harden Mo 03/24/2023, 11:49 AM

## 2023-03-24 NOTE — Progress Notes (Signed)
Patient resting quietly in bed with eyes closed, Respirations equal and unlabored, skin warm and dry, NAD. Routine safety checks conducted according to facility protocol. Will continue to monitor for safety. 

## 2023-03-25 DIAGNOSIS — F333 Major depressive disorder, recurrent, severe with psychotic symptoms: Secondary | ICD-10-CM | POA: Diagnosis not present

## 2023-03-25 NOTE — Progress Notes (Signed)
Miranda Delgado is seen on rounds.  She is a patient of Dr. Adriana Simas for [facility] MD Progress Note  03/25/2023 5:22 PM Miranda Delgado  MRN:  [id number] Subjective: Miranda Delgado is seen on rounds.  She is a patient of Dr. Maryruth Bun.  She had stopped her Risperdal and Prozac and has restarted it since being on the unit and denies any side effects.  Nurses report no issues.  She states that she is feeling better.  She said that she slept well and ate well.  No side effects from her medications.  Principal Problem: MDD (major depressive disorder) Diagnosis: Principal Problem:   MDD (major depressive disorder)  Total Time spent with patient: 15 minutes  Past Psychiatric History: Depression  Past Medical History:  Past Medical History:  Diagnosis Date   Diabetes mellitus without complication (HCC)    Hypertension     Past Surgical History:  Procedure Laterality Date   CESAREAN SECTION     COLONOSCOPY WITH PROPOFOL N/A 09/19/2022   Procedure: COLONOSCOPY WITH PROPOFOL;  Surgeon: Jaynie Collins, DO;  Location[facility] ENDOSCOPY;  Service: Gastroenterology;  Laterality: N/A;   TONSILLECTOMY     Family History: History reviewed. No pertinent family history. Family Psychiatric  History: Unremarkable Social History:  Social History   Substance and Sexual Activity  Alcohol Use Not Currently     Social History   Substance and Sexual Activity  Drug Use Not Currently    Social History   Socioeconomic History   Marital status: Married    Spouse name: Not on file   Number of children: Not on file   Years of education: Not on file   Highest education level: Not on file  Occupational History   Not on file  Tobacco Use   Smoking status: Never   Smokeless tobacco: Never  Vaping Use   Vaping Use: Never used  Substance and Sexual Activity   Alcohol use: Not Currently   Drug use: Not Currently   Sexual activity: Not Currently  Other Topics Concern   Not on file  Social History Narrative   Not on  file   Social Determinants of Health   Financial Resource Strain: Not on file  Food Insecurity: No Food Insecurity (03/23/2023)   Hunger Vital Sign    Worried About Running Out of Food in the Last Year: Never true    Ran Out of Food in the Last Year: Never true  Transportation Needs: No Transportation Needs (03/23/2023)   PRAPARE - Administrator, Civil Service (Medical): No    Lack of Transportation (Non-Medical): No  Physical Activity: Not on file  Stress: Not on file  Social Connections: Not on file   Additional Social History:                         Sleep: Good  Appetite:  Good  Current Medications: Current Facility-Administered Medications  Medication Dose Route Frequency Provider Last Rate Last Admin   acetaminophen (TYLENOL) tablet 650 mg  650 mg Oral Q6H PRN Vanetta Mulders, NP       alum & mag hydroxide-simeth (MAALOX/MYLANTA) 200-200-20 MG/5ML suspension 30 mL  30 mL Oral Q4H PRN Gabriel Cirri F, NP       amLODipine (NORVASC) tablet 10 mg  10 mg Oral Daily Gabriel Cirri F, NP   10 mg at 03/24/23 1747   aspirin EC tablet 81 mg  81 mg Oral q1800 Sarina Ill, DO  81 mg at 03/24/23 1745   cyanocobalamin (VITAMIN B12) tablet 1,000 mcg  1,000 mcg Oral Daily Gabriel Cirri F, NP   1,000 mcg at 03/25/23 1012   diphenhydrAMINE (BENADRYL) capsule 50 mg  50 mg Oral TID PRN Vanetta Mulders, NP       Or   diphenhydrAMINE (BENADRYL) injection 50 mg  50 mg Intramuscular TID PRN Vanetta Mulders, NP       FLUoxetine (PROZAC) capsule 20 mg  20 mg Oral q morning Gabriel Cirri F, NP   20 mg at 03/25/23 1014   haloperidol (HALDOL) tablet 5 mg  5 mg Oral TID PRN Vanetta Mulders, NP       Or   haloperidol lactate (HALDOL) injection 5 mg  5 mg Intramuscular TID PRN Vanetta Mulders, NP       iron polysaccharides (NIFEREX) capsule 150 mg  150 mg Oral Daily Gabriel Cirri F, NP   150 mg at 03/25/23 1012   lisinopril (ZESTRIL) tablet  40 mg  40 mg Oral Daily Gabriel Cirri F, NP   40 mg at 03/24/23 1747   LORazepam (ATIVAN) tablet 2 mg  2 mg Oral TID PRN Vanetta Mulders, NP       Or   LORazepam (ATIVAN) injection 2 mg  2 mg Intramuscular TID PRN Vanetta Mulders, NP       magnesium hydroxide (MILK OF MAGNESIA) suspension 30 mL  30 mL Oral Daily PRN Gabriel Cirri F, NP       magnesium oxide (MAG-OX) tablet 400 mg  400 mg Oral Daily Gabriel Cirri F, NP   400 mg at 03/25/23 1011   metFORMIN (GLUCOPHAGE) tablet 500 mg  500 mg Oral BID WC Gabriel Cirri F, NP   500 mg at 03/25/23 1013   risperiDONE (RISPERDAL) tablet 1 mg  1 mg Oral QHS Gabriel Cirri F, NP   1 mg at 03/24/23 2104   [START ON 03/26/2023] Semaglutide(0.25 or 0.5MG /DOS) SOPN 0.25 mg  0.25 mg Subcutaneous Weekly Sharen Hones, RPH       simvastatin (ZOCOR) tablet 20 mg  20 mg Oral q1800 Sarina Ill, DO   20 mg at 03/24/23 1745    Lab Results: No results found for this or any previous visit (from the past 48 hour(s)).  Blood Alcohol level:  Lab Results  Component Value Date   ETH <5 07/15/2017    Metabolic Disorder Labs: No results found for: "HGBA1C", "MPG" No results found for: "PROLACTIN" No results found for: "CHOL", "TRIG", "HDL", "CHOLHDL", "VLDL", "LDLCALC"  Physical Findings: AIMS:  , ,  ,  ,    CIWA:    COWS:     Musculoskeletal: Strength & Muscle Tone: within normal limits Gait & Station: normal Patient leans: N/A  Psychiatric Specialty Exam:  Presentation  General Appearance:  Appropriate for Environment; Casual  Eye Contact: Good  Speech: Clear and Coherent  Speech Volume: Normal  Handedness: Right   Mood and Affect  Mood: Anxious  Affect: Depressed   Thought Process  Thought Processes: Coherent  Descriptions of Associations:Intact  Orientation:Full (Time, Place and Person)  Thought Content:WDL (History of some paranoid thoughts recently)  History of  Schizophrenia/Schizoaffective disorder:No  Duration of Psychotic Symptoms:No data recorded Hallucinations:No data recorded Ideas of Reference:Paranoia  Suicidal Thoughts:No data recorded Homicidal Thoughts:No data recorded  Sensorium  Memory: Immediate Good  Judgment: Fair  Insight: Fair   Art therapist  Concentration: Fair  Attention Span: Fair  Recall:  Fair  Fund of Knowledge:No data recorded Language: Fair   Psychomotor Activity  Psychomotor Activity:No data recorded  Assets  Assets: Communication Skills; Desire for Improvement; Financial Resources/Insurance; Housing; Social Support; Resilience; Physical Health   Sleep  Sleep:No data recorded   Blood pressure (!) 131/52, pulse 87, temperature 97.7 F (36.5 C), resp. rate 15, height 5\' 3"  (1.6 m), weight 61.2 kg, SpO2 100 %. Body mass index is 23.91 kg/m.   Treatment Plan Summary: Daily contact with patient to assess and evaluate symptoms and progress in treatment, Medication management, and Plan continue current medications.  Rolly Magri [place] Mall, DO 03/25/2023, 5:22 PM

## 2023-03-25 NOTE — Progress Notes (Signed)
Patient is A+O x 4. Affect appropriate. Mood pleasant. Denies SI/HI/AVH. Denies anxiety and depression. Appetite good. Attended exercise group and rec activity in the courtyard.  Patient denies pain. No Prn's adm. Amlodipine and lisinopril held this morning and given with evening meds per the request of the patient.  Q15 minute unit checks in place.

## 2023-03-25 NOTE — Progress Notes (Signed)
Patient is visited by her daughter, Marchelle Folks, patient stated that, "my wedding ring is getting too big and I don't want to lose it." Patient's wedding ring was taken off and given to her daughter. Daughter left the unit with patient's wedding ring.

## 2023-03-25 NOTE — Plan of Care (Signed)
  Problem: Coping: Goal: Level of anxiety will decrease Outcome: Progressing   Problem: Safety: Goal: Ability to remain free from injury will improve Outcome: Progressing   Problem: Skin Integrity: Goal: Risk for impaired skin integrity will decrease Outcome: Progressing   

## 2023-03-25 NOTE — Progress Notes (Signed)
Patient takes her weekly semiglutide injection on Sundays and is available as a home med in the pharmacy.

## 2023-03-25 NOTE — Progress Notes (Signed)
   03/25/23 2000  Psych Admission Type (Psych Patients Only)  Admission Status Voluntary  Psychosocial Assessment  Patient Complaints None  Eye Contact Fair  Facial Expression Animated  Affect Appropriate to circumstance  Speech Logical/coherent  Interaction Assertive  Motor Activity Slow  Appearance/Hygiene Unremarkable  Behavior Characteristics Cooperative  Mood Pleasant  Thought Process  Coherency WDL  Content WDL  Delusions None reported or observed  Perception WDL  Hallucination None reported or observed  Judgment WDL  Confusion None  Danger to Self  Current suicidal ideation? Denies  Agreement Not to Harm Self Yes  Description of Agreement Verbal  Danger to Others  Danger to Others None reported or observed

## 2023-03-25 NOTE — Group Note (Signed)
Date:  03/25/2023 Time:  10:59 AM  Group Topic/Focus:  Group Movement-Chair Zumba     Participation Level:  Active  Participation Quality:  Appropriate  Affect:  Appropriate  Cognitive:  Alert and Appropriate  Insight: Appropriate  Engagement in Group:  Engaged  Modes of Intervention:  Socialization  Additional Comments:    Marta Antu 03/25/2023, 10:59 AM

## 2023-03-25 NOTE — Group Note (Signed)
Date:  03/25/2023 Time:  7:18 PM  Group Topic/Focus:  Rec Group   The focus of this group is to let the patients go out and get fresh air and listen to music of their choices.     Participation Level:  Active  Participation Quality:  Appropriate  Affect:  Appropriate  Cognitive:  Alert and Appropriate  Insight: Appropriate  Engagement in Group:  Engaged  Modes of Intervention:  Activity  Additional Comments:    Marta Antu 03/25/2023, 7:18 PM

## 2023-03-26 DIAGNOSIS — F333 Major depressive disorder, recurrent, severe with psychotic symptoms: Secondary | ICD-10-CM | POA: Diagnosis not present

## 2023-03-26 NOTE — Progress Notes (Signed)
[facility] MD Progress Note  03/26/2023 2:50 PM Miranda Delgado  MRN:  [id number] Subjective: Miranda Delgado is seen on rounds.  She is telling me that since starting on medications she is feeling much better.  She is smiling.  She denies any side effects from her medicine.  Nurses report no problems. Principal Problem: MDD (major depressive disorder) Diagnosis: Principal Problem:   MDD (major depressive disorder)  Total Time spent with patient: 15 minutes  Past Psychiatric History: Depression  Past Medical History:  Past Medical History:  Diagnosis Date   Diabetes mellitus without complication (HCC)    Hypertension     Past Surgical History:  Procedure Laterality Date   CESAREAN SECTION     COLONOSCOPY WITH PROPOFOL N/A 09/19/2022   Procedure: COLONOSCOPY WITH PROPOFOL;  Surgeon: Jaynie Collins, DO;  Location: [facility] ENDOSCOPY;  Service: Gastroenterology;  Laterality: N/A;   TONSILLECTOMY     Family History: History reviewed. No pertinent family history. Family Psychiatric  History: Unremarkable Social History:  Social History   Substance and Sexual Activity  Alcohol Use Not Currently     Social History   Substance and Sexual Activity  Drug Use Not Currently    Social History   Socioeconomic History   Marital status: Married    Spouse name: Not on file   Number of children: Not on file   Years of education: Not on file   Highest education level: Not on file  Occupational History   Not on file  Tobacco Use   Smoking status: Never   Smokeless tobacco: Never  Vaping Use   Vaping Use: Never used  Substance and Sexual Activity   Alcohol use: Not Currently   Drug use: Not Currently   Sexual activity: Not Currently  Other Topics Concern   Not on file  Social History Narrative   Not on file   Social Determinants of Health   Financial Resource Strain: Not on file  Food Insecurity: No Food Insecurity (03/23/2023)   Hunger Vital Sign    Worried About Running Out of  Food in the Last Year: Never true    Ran Out of Food in the Last Year: Never true  Transportation Needs: No Transportation Needs (03/23/2023)   PRAPARE - Administrator, Civil Service (Medical): No    Lack of Transportation (Non-Medical): No  Physical Activity: Not on file  Stress: Not on file  Social Connections: Not on file   Additional Social History:                         Sleep: Good  Appetite:  Good  Current Medications: Current Facility-Administered Medications  Medication Dose Route Frequency Provider Last Rate Last Admin   acetaminophen (TYLENOL) tablet 650 mg  650 mg Oral Q6H PRN Vanetta Mulders, NP       alum & mag hydroxide-simeth (MAALOX/MYLANTA) 200-200-20 MG/5ML suspension 30 mL  30 mL Oral Q4H PRN Gabriel Cirri F, NP       amLODipine (NORVASC) tablet 10 mg  10 mg Oral Daily Gabriel Cirri F, NP   10 mg at 03/25/23 1733   aspirin EC tablet 81 mg  81 mg Oral q1800 Sarina Ill, DO   81 mg at 03/25/23 1734   cyanocobalamin (VITAMIN B12) tablet 1,000 mcg  1,000 mcg Oral Daily Gabriel Cirri F, NP   1,000 mcg at 03/26/23 0813   diphenhydrAMINE (BENADRYL) capsule 50 mg  50 mg Oral TID  PRN Vanetta Mulders, NP       Or   diphenhydrAMINE (BENADRYL) injection 50 mg  50 mg Intramuscular TID PRN Vanetta Mulders, NP       FLUoxetine (PROZAC) capsule 20 mg  20 mg Oral q morning Gabriel Cirri F, NP   20 mg at 03/26/23 2952   haloperidol (HALDOL) tablet 5 mg  5 mg Oral TID PRN Vanetta Mulders, NP       Or   haloperidol lactate (HALDOL) injection 5 mg  5 mg Intramuscular TID PRN Vanetta Mulders, NP       iron polysaccharides (NIFEREX) capsule 150 mg  150 mg Oral Daily Gabriel Cirri F, NP   150 mg at 03/26/23 8413   lisinopril (ZESTRIL) tablet 40 mg  40 mg Oral Daily Gabriel Cirri F, NP   40 mg at 03/25/23 1734   LORazepam (ATIVAN) tablet 2 mg  2 mg Oral TID PRN Vanetta Mulders, NP       Or   LORazepam (ATIVAN)  injection 2 mg  2 mg Intramuscular TID PRN Vanetta Mulders, NP       magnesium hydroxide (MILK OF MAGNESIA) suspension 30 mL  30 mL Oral Daily PRN Gabriel Cirri F, NP       magnesium oxide (MAG-OX) tablet 400 mg  400 mg Oral Daily Gabriel Cirri F, NP   400 mg at 03/26/23 2440   metFORMIN (GLUCOPHAGE) tablet 500 mg  500 mg Oral BID WC Gabriel Cirri F, NP   500 mg at 03/26/23 1027   risperiDONE (RISPERDAL) tablet 1 mg  1 mg Oral QHS Gabriel Cirri F, NP   1 mg at 03/25/23 2032   Semaglutide(0.25 or 0.5MG /DOS) Namon Cirri 0.25 mg  0.25 mg Subcutaneous Weekly Sharen Hones, RPH   0.25 mg at 03/26/23 0951   simvastatin (ZOCOR) tablet 20 mg  20 mg Oral q1800 Sarina Ill, DO   20 mg at 03/25/23 1734    Lab Results: No results found for this or any previous visit (from the past 48 hour(s)).  Blood Alcohol level:  Lab Results  Component Value Date   ETH <5 07/15/2017    Metabolic Disorder Labs: No results found for: "HGBA1C", "MPG" No results found for: "PROLACTIN" No results found for: "CHOL", "TRIG", "HDL", "CHOLHDL", "VLDL", "LDLCALC"  Physical Findings: AIMS:  , ,  ,  ,    CIWA:    COWS:     Musculoskeletal: Strength & Muscle Tone: within normal limits Gait & Station: normal Patient leans: N/A  Psychiatric Specialty Exam:  Presentation  General Appearance:  Appropriate for Environment; Casual  Eye Contact: Good  Speech: Clear and Coherent  Speech Volume: Normal  Handedness: Right   Mood and Affect  Mood: Anxious  Affect: Depressed   Thought Process  Thought Processes: Coherent  Descriptions of Associations:Intact  Orientation:Full (Time, Place and Person)  Thought Content:WDL (History of some paranoid thoughts recently)  History of Schizophrenia/Schizoaffective disorder:No  Duration of Psychotic Symptoms:No data recorded Hallucinations:No data recorded Ideas of Reference:Paranoia  Suicidal Thoughts:No data recorded Homicidal  Thoughts:No data recorded  Sensorium  Memory: Immediate Good  Judgment: Fair  Insight: Fair   Art therapist  Concentration: Fair  Attention Span: Fair  Recall: [employer] of Knowledge:No data recorded Language: Fair   Psychomotor Activity  Psychomotor Activity:No data recorded  Assets  Assets: Manufacturing systems engineer; Desire for Improvement; Financial Resources/Insurance; Housing; Social Support; Resilience; Physical Health   Sleep  Sleep:No data  recorded    Blood pressure (!) 134/52, pulse 81, temperature 98.6 F (37 C), resp. rate 18, height 5\' 3"  (1.6 m), weight 61.2 kg, SpO2 100 %. Body mass index is 23.91 kg/m.   Treatment Plan Summary: Daily contact with patient to assess and evaluate symptoms and progress in treatment, Medication management, and Plan continue current medications.  Akiyah Eppolito Tresea Mall, DO 03/26/2023, 2:50 PM

## 2023-03-26 NOTE — Progress Notes (Signed)
   03/26/23 0600  15 Minute Checks  Location Bedroom  Visual Appearance Calm  Behavior Sleeping  Sleep (Behavioral Health Patients Only)  Calculate sleep? (Click Yes once per 24 hr at 0600 safety check) Yes  Documented sleep last 24 hours 9.75

## 2023-03-26 NOTE — Progress Notes (Signed)
   03/26/23 2100  Psych Admission Type (Psych Patients Only)  Admission Status Voluntary  Psychosocial Assessment  Patient Complaints None  Eye Contact Fair  Facial Expression Animated  Affect Appropriate to circumstance  Speech Logical/coherent  Interaction Assertive  Motor Activity Slow  Appearance/Hygiene Unremarkable  Behavior Characteristics Cooperative;Appropriate to situation  Mood Pleasant  Thought Process  Coherency WDL  Content WDL  Delusions None reported or observed  Perception WDL  Hallucination None reported or observed  Judgment WDL  Confusion None  Danger to Self  Current suicidal ideation? Denies  Agreement Not to Harm Self Yes  Description of Agreement Verbal  Danger to Others  Danger to Others None reported or observed

## 2023-03-26 NOTE — Progress Notes (Signed)
Patient denies SI, HI, and AVH. She says that her anxiety has improved. She says her sleep was "good enough" last night. She is compliant with scheduled medications. Patient given support and encouragement. She is observed interacting appropriately with staff and other patients. She remains safe on the unit at this time.

## 2023-03-26 NOTE — Group Note (Signed)
Date:  03/26/2023 Time:  9:08 PM  Group Topic/Focus:  Overcoming Stress:   The focus of this group is to define stress and help patients assess their triggers.    Participation Level:  Active  Participation Quality:  Appropriate  Affect:  Appropriate  Cognitive:  Alert  Insight: Appropriate and Good  Engagement in Group:  Developing/Improving  Modes of Intervention:  Discussion  Additional Comments:    Garry Heater 03/26/2023, 9:08 PM

## 2023-03-26 NOTE — Group Note (Signed)
LCSW Group Therapy Note   Group Date: 03/26/2023 Start Time: 1305 End Time: 1340   Type of Therapy and Topic:  Group Therapy: Fear of Asking For Help  Participation Level:  Active   Summary of Patient Progress:  The patient attended group. The patient participated in today Icebreaker. The patient stated that she does not really have any fear when asking for help but she can understand why others may have that problem.   Marshell Levan, LCSWA 03/26/2023  3:53 PM

## 2023-03-27 NOTE — Group Note (Addendum)
[facility] LCSW Group Therapy Note    Group Date: 03/27/2023 Start Time: 1300 End Time: 1400  Type of Therapy and Topic:  Group Therapy:  Overcoming Obstacles  Participation Level:  [facility] PARTICIPATION LEVEL: Active  Mood: Euthymic  Description of Group:   In this group patients will be encouraged to explore what they see as obstacles to their own wellness and recovery. They will be guided to discuss their thoughts, feelings, and behaviors related to these obstacles. The group will process together ways to cope with barriers, with attention given to specific choices patients can make. Each patient will be challenged to identify changes they are motivated to make in order to overcome their obstacles. This group will be process-oriented, with patients participating in exploration of their own experiences as well as giving and receiving support and challenge from other group members.  Therapeutic Goals: 1. Patient will identify personal and current obstacles as they relate to admission. 2. Patient will identify barriers that currently interfere with their wellness or overcoming obstacles.  3. Patient will identify feelings, thought process and behaviors related to these barriers. 4. Patient will identify two changes they are willing to make to overcome these obstacles:    Summary of Patient Progress  Patient was present for the entirety of the group session. Patient was an active listener and participated in the topic of discussion, provided helpful advice to others, and added nuance to topic of conversation. Patient shared she often is overly empathetic, though did not necessarily go into any consequences of being so    Therapeutic Modalities:   Cognitive Behavioral Therapy Solution Focused Therapy Motivational Interviewing Relapse Prevention Therapy   Corky Crafts, LCSWA

## 2023-03-27 NOTE — Progress Notes (Signed)
Patient pleasant and cooperative.  Good appetite and slept well last night.  Patient reports her anxiety has greatly improved.  Denies depression, SI, HI and AVH.  Compliant with scheduled medications.  15 min checks in place for safety.  Patient is present in the milieu.  Appropriate interaction with peers and staff.

## 2023-03-27 NOTE — Group Note (Signed)
Date:  03/27/2023 Time:  11:22 AM  Group Topic/Focus:  Developing a Wellness Toolbox:   The focus of this group is to help patients develop a "wellness toolbox" with skills and strategies to promote recovery upon discharge. Managing Feelings:   The focus of this group is to identify what feelings patients have difficulty handling and develop a plan to handle them in a healthier way upon discharge. Wellness Toolbox:   The focus of this group is to discuss various aspects of wellness, balancing those aspects and exploring ways to increase the ability to experience wellness.  Patients will create a wellness toolbox for use upon discharge.    Participation Level:  Active  Participation Quality:  Appropriate  Affect:  Appropriate  Cognitive:  Alert, Appropriate, and Oriented  Insight: Appropriate  Engagement in Group:  Engaged  Modes of Intervention:  Activity, Discussion, and Socialization  Additional Comments:    Miranda Delgado 03/27/2023, 11:22 AM

## 2023-03-27 NOTE — Plan of Care (Signed)
  Problem: Education: Goal: Knowledge of General Education information will improve Description: Including pain rating scale, medication(s)/side effects and non-pharmacologic comfort measures Outcome: Progressing   Problem: Activity: Goal: Risk for activity intolerance will decrease Outcome: Progressing   Problem: Nutrition: Goal: Adequate nutrition will be maintained Outcome: Progressing   Problem: Coping: Goal: Level of anxiety will decrease Outcome: Progressing   

## 2023-03-27 NOTE — Group Note (Signed)
Recreation Therapy Group Note   Group Topic:Goal Setting  Group Date: 03/27/2023 Start Time: 1400 End Time: 1445 Facilitators: Rosina Lowenstein, LRT, CTRS Location:  Dayroom  Group Description: Vision Board. Patients were given many different magazines, a glue stick, markers, and a piece of cardstock paper. LRT and pts discussed the importance of having goals in life. LRT and pts discussed the difference between short-term and long-term goals, as well as what a SMART goal is. LRT encouraged pts to create a vision board, with images they picked and then cut out with safety scissors from the magazine, for themselves, that capture their short and long-term goals. LRT encouraged pts to show and explain their vision board to the group. LRT offered to laminate vision board once dry and complete.   Goal Area(s) Addressed:  Patient will gain knowledge of short vs. long term goals.  Patient will identify goals for themselves. Patient will practice setting SMART goals. Patient will verbalize their goals to LRT and peers.  Affect/Mood: Appropriate   Participation Level: Active and Engaged   Participation Quality: Independent   Behavior: Calm and Cooperative   Speech/Thought Process: Coherent   Insight: Good   Judgement: Good   Modes of Intervention: Art   Patient Response to Interventions:  Attentive, Engaged, Interested , and Receptive   Education Outcome:  Acknowledges education   Clinical Observations/Individualized Feedback: Miranda Delgado was active in their participation of session activities and group discussion. Pt identified "I want to spent time with my grand kids, I want to eat healthier and I want to get into some sort of craft" as her goals. Pt shared that due to her age, most of her goals are short term. Pt appropriately cut out images that related to her identified goals. Pt spontaneously contributed to group discussion. Pt interacted well with LRT and peers duration of session.    Plan: Continue to engage patient in RT group sessions 2-3x/week.   Rosina Lowenstein, LRT, CTRS 03/27/2023 3:10 PM

## 2023-03-27 NOTE — Progress Notes (Signed)
[facility] MD Progress Note  03/27/2023 1:04 PM Miranda Delgado  MRN:  [id number] Subjective: Miranda Delgado is seen on rounds.  She says that she is feeling better since restarting her medications.  Nurses report no issues.  She has no complaints and no side effects from her medications. Principal Problem: MDD (major depressive disorder) Diagnosis: Principal Problem:   MDD (major depressive disorder)  Total Time spent with patient: 15 minutes  Past Psychiatric History: History of depression  Past Medical History:  Past Medical History:  Diagnosis Date   Diabetes mellitus without complication (HCC)    Hypertension     Past Surgical History:  Procedure Laterality Date   CESAREAN SECTION     COLONOSCOPY WITH PROPOFOL N/A 09/19/2022   Procedure: COLONOSCOPY WITH PROPOFOL;  Surgeon: Jaynie Collins, DO;  Location: [facility] ENDOSCOPY;  Service: Gastroenterology;  Laterality: N/A;   TONSILLECTOMY     Family History: History reviewed. No pertinent family history. Family Psychiatric  History: Unremarkable Social History:  Social History   Substance and Sexual Activity  Alcohol Use Not Currently     Social History   Substance and Sexual Activity  Drug Use Not Currently    Social History   Socioeconomic History   Marital status: Married    Spouse name: Not on file   Number of children: Not on file   Years of education: Not on file   Highest education level: Not on file  Occupational History   Not on file  Tobacco Use   Smoking status: Never   Smokeless tobacco: Never  Vaping Use   Vaping Use: Never used  Substance and Sexual Activity   Alcohol use: Not Currently   Drug use: Not Currently   Sexual activity: Not Currently  Other Topics Concern   Not on file  Social History Narrative   Not on file   Social Determinants of Health   Financial Resource Strain: Not on file  Food Insecurity: No Food Insecurity (03/23/2023)   Hunger Vital Sign    Worried About Running Out of Food  in the Last Year: Never true    Ran Out of Food in the Last Year: Never true  Transportation Needs: No Transportation Needs (03/23/2023)   PRAPARE - Administrator, Civil Service (Medical): No    Lack of Transportation (Non-Medical): No  Physical Activity: Not on file  Stress: Not on file  Social Connections: Not on file   Additional Social History:                         Sleep: Good  Appetite:  Good  Current Medications: Current Facility-Administered Medications  Medication Dose Route Frequency Provider Last Rate Last Admin   acetaminophen (TYLENOL) tablet 650 mg  650 mg Oral Q6H PRN Vanetta Mulders, NP       alum & mag hydroxide-simeth (MAALOX/MYLANTA) 200-200-20 MG/5ML suspension 30 mL  30 mL Oral Q4H PRN Gabriel Cirri F, NP       amLODipine (NORVASC) tablet 10 mg  10 mg Oral Daily Gabriel Cirri F, NP   10 mg at 03/26/23 1652   aspirin EC tablet 81 mg  81 mg Oral q1800 Sarina Ill, DO   81 mg at 03/26/23 1653   cyanocobalamin (VITAMIN B12) tablet 1,000 mcg  1,000 mcg Oral Daily Gabriel Cirri F, NP   1,000 mcg at 03/27/23 0933   diphenhydrAMINE (BENADRYL) capsule 50 mg  50 mg Oral TID PRN Barthold,  Nickola Major, NP       Or   diphenhydrAMINE (BENADRYL) injection 50 mg  50 mg Intramuscular TID PRN Vanetta Mulders, NP       FLUoxetine (PROZAC) capsule 20 mg  20 mg Oral q morning Gabriel Cirri F, NP   20 mg at 03/27/23 7829   haloperidol (HALDOL) tablet 5 mg  5 mg Oral TID PRN Vanetta Mulders, NP       Or   haloperidol lactate (HALDOL) injection 5 mg  5 mg Intramuscular TID PRN Vanetta Mulders, NP       iron polysaccharides (NIFEREX) capsule 150 mg  150 mg Oral Daily Gabriel Cirri F, NP   150 mg at 03/27/23 0933   lisinopril (ZESTRIL) tablet 40 mg  40 mg Oral Daily Gabriel Cirri F, NP   40 mg at 03/26/23 1653   LORazepam (ATIVAN) tablet 2 mg  2 mg Oral TID PRN Vanetta Mulders, NP       Or   LORazepam (ATIVAN) injection 2  mg  2 mg Intramuscular TID PRN Vanetta Mulders, NP       magnesium hydroxide (MILK OF MAGNESIA) suspension 30 mL  30 mL Oral Daily PRN Gabriel Cirri F, NP       magnesium oxide (MAG-OX) tablet 400 mg  400 mg Oral Daily Gabriel Cirri F, NP   400 mg at 03/27/23 5621   metFORMIN (GLUCOPHAGE) tablet 500 mg  500 mg Oral BID WC Gabriel Cirri F, NP   500 mg at 03/27/23 0933   risperiDONE (RISPERDAL) tablet 1 mg  1 mg Oral QHS Gabriel Cirri F, NP   1 mg at 03/26/23 2100   Semaglutide(0.25 or 0.5MG /DOS) SOPN 0.25 mg  0.25 mg Subcutaneous Weekly Sharen Hones, RPH   0.25 mg at 03/26/23 0951   simvastatin (ZOCOR) tablet 20 mg  20 mg Oral q1800 Sarina Ill, DO   20 mg at 03/26/23 1653    Lab Results: No results found for this or any previous visit (from the past 48 hour(s)).  Blood Alcohol level:  Lab Results  Component Value Date   ETH <5 07/15/2017    Metabolic Disorder Labs: No results found for: "HGBA1C", "MPG" No results found for: "PROLACTIN" No results found for: "CHOL", "TRIG", "HDL", "CHOLHDL", "VLDL", "LDLCALC"  Physical Findings: AIMS:  , ,  ,  ,    CIWA:    COWS:     Musculoskeletal: Strength & Muscle Tone: within normal limits Gait & Station: normal Patient leans: N/A  Psychiatric Specialty Exam:  Presentation  General Appearance:  Appropriate for Environment; Casual  Eye Contact: Good  Speech: Clear and Coherent  Speech Volume: Normal  Handedness: Right   Mood and Affect  Mood: Anxious  Affect: Depressed   Thought Process  Thought Processes: Coherent  Descriptions of Associations:Intact  Orientation:Full (Time, Place and Person)  Thought Content:WDL (History of some paranoid thoughts recently)  History of Schizophrenia/Schizoaffective disorder:No  Duration of Psychotic Symptoms:No data recorded Hallucinations:No data recorded Ideas of Reference:Paranoia  Suicidal Thoughts:No data recorded Homicidal Thoughts:No  data recorded  Sensorium  Memory: Immediate Good  Judgment: Fair  Insight: Fair   Art therapist  Concentration: Fair  Attention Span: Fair  Recall: [employer] of Knowledge:No data recorded Language: Fair   Psychomotor Activity  Psychomotor Activity:No data recorded  Assets  Assets: Manufacturing systems engineer; Desire for Improvement; Financial Resources/Insurance; Housing; Social Support; Resilience; Physical Health   Sleep  Sleep:No data recorded  Physical Exam: Physical Exam Vitals and nursing note reviewed.  Constitutional:      Appearance: Normal appearance. She is normal weight.  Neurological:     General: No focal deficit present.     Mental Status: She is alert and oriented to person, place, and time.  Psychiatric:        Attention and Perception: Attention and perception normal.        Mood and Affect: Mood and affect normal.        Speech: Speech normal.        Behavior: Behavior normal. Behavior is cooperative.        Thought Content: Thought content normal.        Cognition and Memory: Cognition and memory normal.        Judgment: Judgment normal.    Review of Systems  Constitutional: Negative.   HENT: Negative.    Eyes: Negative.   Respiratory: Negative.    Cardiovascular: Negative.   Gastrointestinal: Negative.   Genitourinary: Negative.   Musculoskeletal: Negative.   Skin: Negative.   Neurological: Negative.   Endo/Heme/Allergies: Negative.   Psychiatric/Behavioral:  Positive for depression.    Blood pressure (!) 125/49, pulse 91, temperature 98.1 F (36.7 C), resp. rate 20, height 5\' 3"  (1.6 m), weight 61.2 kg, SpO2 100 %. Body mass index is 23.91 kg/m.   Treatment Plan Summary: Daily contact with patient to assess and evaluate symptoms and progress in treatment, Medication management, and Plan continue current medications.  Sarina Ill, DO 03/27/2023, 1:04 PM

## 2023-03-27 NOTE — Progress Notes (Signed)
   03/27/23 2000  Psych Admission Type (Psych Patients Only)  Admission Status Voluntary  Psychosocial Assessment  Patient Complaints None  Eye Contact Fair  Facial Expression Animated  Affect Appropriate to circumstance  Speech Logical/coherent  Interaction Assertive  Motor Activity Slow  Appearance/Hygiene Unremarkable  Behavior Characteristics Cooperative;Appropriate to situation  Mood Pleasant  Thought Process  Coherency WDL  Content WDL  Delusions None reported or observed  Perception WDL  Hallucination None reported or observed  Judgment WDL  Confusion None  Danger to Self  Current suicidal ideation? Denies  Agreement Not to Harm Self Yes  Description of Agreement Verbal  Danger to Others  Danger to Others None reported or observed

## 2023-03-27 NOTE — Progress Notes (Signed)
   03/27/23 0554  15 Minute Checks  Location Bedroom  Visual Appearance Calm  Behavior Sleeping  Sleep (Behavioral Health Patients Only)  Calculate sleep? (Click Yes once per 24 hr at 0600 safety check) Yes  Documented sleep last 24 hours 9

## 2023-03-28 MED ORDER — RISPERIDONE 1 MG PO TABS
0.5000 mg | ORAL_TABLET | ORAL | Status: DC
  Administered 2023-03-28 – 2023-03-31 (×7): 0.5 mg via ORAL
  Filled 2023-03-28 (×7): qty 1

## 2023-03-28 NOTE — Group Note (Signed)
Recreation Therapy Group Note   Group Topic:Health and Wellness  Group Date: 03/28/2023 Start Time: 1400 End Time: 1445 Facilitators: Rosina Lowenstein, LRT, CTRS Location:  Dayroom  Group Description: Seated Exercise. LRT discussed the mental and physical benefits of exercise. LRT and group discussed how physical activity can be used as a coping skill. Pt's and LRT followed along to an exercise video on the TV screen that provided a visual representation and audio description of every exercise performed. Pt's encouraged to listen to their bodies and stop at any time if they experience feelings of discomfort or pain. LRT passed out water after session was over and encouraged pts do drink and stay hydrated.  Goal Area(s) Addressed: Patient will learn benefits of physical activity. Patient will identify exercise as a coping skill.  Patient will follow multistep directions. Patient will try a new leisure interest.   Affect/Mood: Appropriate   Participation Level: Active and Engaged   Participation Quality: Independent   Behavior: Calm and Cooperative   Speech/Thought Process: Coherent   Insight: Good   Judgement: Good   Modes of Intervention: Activity   Patient Response to Interventions:  Attentive, Engaged, Interested , and Receptive   Education Outcome:  Acknowledges education   Clinical Observations/Individualized Feedback: Miranda Delgado was active in their participation of session activities and group discussion. Pt identified "I walk and have one of those portable step things" as exercise she does outside of the hospital. Pt completed all exercises appropriately. Pt interacted well with LRT and peers duration of session.   Plan: Continue to engage patient in RT group sessions 2-3x/week.   Rosina Lowenstein, LRT, CTRS 03/28/2023 3:44 PM

## 2023-03-28 NOTE — Group Note (Signed)
Date:  03/28/2023 Time:  7:12 AM  Group Topic/Focus:  Making Healthy Choices:   The focus of this group is to help patients identify negative/unhealthy choices they were using prior to admission and identify positive/healthier coping strategies to replace them upon discharge.    Participation Level:  Active  Participation Quality:  Appropriate  Affect:  Appropriate  Cognitive:  Appropriate  Insight: Good  Engagement in Group:  Engaged  Modes of Intervention:  Education  Additional Comments:    Garry Heater 03/28/2023, 7:12 AM

## 2023-03-28 NOTE — Group Note (Signed)
Date:  03/28/2023 Time:  9:23 PM  Group Topic/Focus:  Rediscovering Joy:   The focus of this group is to explore various ways to relieve stress in a positive manner as well as the state of mindfulness.     Participation Level:  Active  Participation Quality:  Appropriate  Affect:  Appropriate  Cognitive:  Appropriate  Insight: Good  Engagement in Group:  Engaged  Modes of Intervention:  Activity and Discussion  Additional Comments: Miranda Delgado is very engaged with her peers showing empathy towards their diagnosis. She help wrap up group volunteering to read the closing poem.   Miranda Delgado Miranda Delgado 03/28/2023, 9:23 PM

## 2023-03-28 NOTE — Progress Notes (Signed)
   03/28/23 0600  15 Minute Checks  Location Bedroom  Visual Appearance Calm  Behavior Sleeping  Sleep (Behavioral Health Patients Only)  Calculate sleep? (Click Yes once per 24 hr at 0600 safety check) Yes  Documented sleep last 24 hours 9.5

## 2023-03-28 NOTE — Progress Notes (Signed)
   03/28/23 1300  Psych Admission Type (Psych Patients Only)  Admission Status Voluntary  Psychosocial Assessment  Patient Complaints None  Eye Contact Fair  Facial Expression Flat  Affect Flat  Speech Logical/coherent  Interaction Assertive  Motor Activity Slow  Appearance/Hygiene Unremarkable  Behavior Characteristics Cooperative;Calm  Mood Pleasant  Thought Process  Coherency WDL  Content WDL  Delusions None reported or observed  Perception WDL  Hallucination None reported or observed  Judgment WDL  Confusion None  Danger to Self  Current suicidal ideation? Denies  Agreement Not to Harm Self Yes  Danger to Others  Danger to Others None reported or observed   Appears calm and cooperative. Denies SI/HI/AVH. Tolerated all meals and medications. Will continue to monitor.

## 2023-03-28 NOTE — Group Note (Signed)
Date:  03/28/2023 Time:  10:05 AM  Group Topic/Focus:  Mindfulness-Allows patients to be mindful about living present  and not dwelling on the past.     Participation Level:  Active  Participation Quality:  Appropriate  Affect:  Appropriate  Cognitive:  Alert and Appropriate  Insight: Appropriate  Engagement in Group:  Engaged  Modes of Intervention:  Activity  Additional Comments:    Mary Secord T Pelham Hennick 03/28/2023, 10:05 AM  

## 2023-03-28 NOTE — Group Note (Signed)
LCSW Group Therapy Note   Group Date: 03/28/2023 Start Time: 1315 End Time: 1400  Type of Therapy and Topic: Group Therapy: Social Supports  Participation Level: [facility] PARTICIPATION LEVEL: Active  Description of Group: In this group, patients will be encouraged to explore what supports means to them.  Patients were asked and encouraged to identify what support means to them as well as the benefits of having a support system.  Each patient will be challenged to think of who their support system consists of and how that support has improved their quality of life.  This group will be process-oriented, with patients participating in exploration of their own experiences as well as giving and receiving support and challenge from other group members.  Therapeutic Goals: Patient will identify personal support system.  Patient will identify benefits of support systems.   Patient will identify types of support systems.   Patient will discuss ways that their support system has provided a positive impact on their wellbeing.     Summary of Patient Progress: Patient was present in group.  Patient was an active participant and was engaged in group discussion. Patient was supportive of other group members.  Patient shared that her husband was her support system.  Patient was able to identify the type of support others in her life provide for her.  Patient was appropriate and engaged throughout group.     Therapeutic Modalities: Cognitive Behavioral Therapy Solution Focused Therapy Motivational Interviewing  Harden Mo, [place] 03/28/2023  2:16 PM

## 2023-03-28 NOTE — Progress Notes (Signed)
[facility] MD Progress Note  03/28/2023 11:52 AM Miranda Delgado  MRN:  [id number] Subjective: Miranda Delgado is seen on rounds.  Nurses report that she is complaining of anxiety.  I discussed starting a low-dose of Risperdal in the daytime.  She thinks that Dr. Maryruth Bun had done this in the past and it was helpful.  Her mood is improving.  She is tolerating medications without any problems.  No side effects.  Mood and appetite are improving Principal Problem: MDD (major depressive disorder) Diagnosis: Principal Problem:   MDD (major depressive disorder)  Total Time spent with patient: 15 minutes  Past Psychiatric History: History of depression and sees Dr. Maryruth Bun outpatient.  Past Medical History:  Past Medical History:  Diagnosis Date   Diabetes mellitus without complication (HCC)    Hypertension     Past Surgical History:  Procedure Laterality Date   CESAREAN SECTION     COLONOSCOPY WITH PROPOFOL N/A 09/19/2022   Procedure: COLONOSCOPY WITH PROPOFOL;  Surgeon: Jaynie Collins, DO;  Location: [facility] ENDOSCOPY;  Service: Gastroenterology;  Laterality: N/A;   TONSILLECTOMY     Family History: History reviewed. No pertinent family history. Family Psychiatric  History: Unremarkable Social History:  Social History   Substance and Sexual Activity  Alcohol Use Not Currently     Social History   Substance and Sexual Activity  Drug Use Not Currently    Social History   Socioeconomic History   Marital status: Married    Spouse name: Not on file   Number of children: Not on file   Years of education: Not on file   Highest education level: Not on file  Occupational History   Not on file  Tobacco Use   Smoking status: Never   Smokeless tobacco: Never  Vaping Use   Vaping Use: Never used  Substance and Sexual Activity   Alcohol use: Not Currently   Drug use: Not Currently   Sexual activity: Not Currently  Other Topics Concern   Not on file  Social History Narrative   Not on file    Social Determinants of Health   Financial Resource Strain: Not on file  Food Insecurity: No Food Insecurity (03/23/2023)   Hunger Vital Sign    Worried About Running Out of Food in the Last Year: Never true    Ran Out of Food in the Last Year: Never true  Transportation Needs: No Transportation Needs (03/23/2023)   PRAPARE - Administrator, Civil Service (Medical): No    Lack of Transportation (Non-Medical): No  Physical Activity: Not on file  Stress: Not on file  Social Connections: Not on file   Additional Social History:                         Sleep: Good  Appetite:  Good  Current Medications: Current Facility-Administered Medications  Medication Dose Route Frequency Provider Last Rate Last Admin   acetaminophen (TYLENOL) tablet 650 mg  650 mg Oral Q6H PRN Vanetta Mulders, NP       alum & mag hydroxide-simeth (MAALOX/MYLANTA) 200-200-20 MG/5ML suspension 30 mL  30 mL Oral Q4H PRN Gabriel Cirri F, NP       amLODipine (NORVASC) tablet 10 mg  10 mg Oral Daily Gabriel Cirri F, NP   10 mg at 03/28/23 0947   aspirin EC tablet 81 mg  81 mg Oral q1800 Sarina Ill, DO   81 mg at 03/27/23 1609   cyanocobalamin (  VITAMIN B12) tablet 1,000 mcg  1,000 mcg Oral Daily Gabriel Cirri F, NP   1,000 mcg at 03/28/23 1610   diphenhydrAMINE (BENADRYL) capsule 50 mg  50 mg Oral TID PRN Vanetta Mulders, NP       Or   diphenhydrAMINE (BENADRYL) injection 50 mg  50 mg Intramuscular TID PRN Vanetta Mulders, NP       FLUoxetine (PROZAC) capsule 20 mg  20 mg Oral q morning Gabriel Cirri F, NP   20 mg at 03/28/23 9604   haloperidol (HALDOL) tablet 5 mg  5 mg Oral TID PRN Vanetta Mulders, NP       Or   haloperidol lactate (HALDOL) injection 5 mg  5 mg Intramuscular TID PRN Vanetta Mulders, NP       iron polysaccharides (NIFEREX) capsule 150 mg  150 mg Oral Daily Gabriel Cirri F, NP   150 mg at 03/28/23 0947   lisinopril (ZESTRIL) tablet 40 mg   40 mg Oral Daily Gabriel Cirri F, NP   40 mg at 03/28/23 0947   LORazepam (ATIVAN) tablet 2 mg  2 mg Oral TID PRN Vanetta Mulders, NP       Or   LORazepam (ATIVAN) injection 2 mg  2 mg Intramuscular TID PRN Vanetta Mulders, NP       magnesium hydroxide (MILK OF MAGNESIA) suspension 30 mL  30 mL Oral Daily PRN Gabriel Cirri F, NP       magnesium oxide (MAG-OX) tablet 400 mg  400 mg Oral Daily Gabriel Cirri F, NP   400 mg at 03/28/23 0947   metFORMIN (GLUCOPHAGE) tablet 500 mg  500 mg Oral BID WC Gabriel Cirri F, NP   500 mg at 03/28/23 0947   risperiDONE (RISPERDAL) tablet 0.5 mg  0.5 mg Oral BH-q8a4p Sarina Ill, DO       risperiDONE (RISPERDAL) tablet 1 mg  1 mg Oral QHS Gabriel Cirri F, NP   1 mg at 03/27/23 2030   Semaglutide(0.25 or 0.5MG /DOS) SOPN 0.25 mg  0.25 mg Subcutaneous Weekly Sharen Hones, RPH   0.25 mg at 03/26/23 0951   simvastatin (ZOCOR) tablet 20 mg  20 mg Oral q1800 Sarina Ill, DO   20 mg at 03/27/23 1609    Lab Results: No results found for this or any previous visit (from the past 48 hour(s)).  Blood Alcohol level:  Lab Results  Component Value Date   ETH <5 07/15/2017    Metabolic Disorder Labs: No results found for: "HGBA1C", "MPG" No results found for: "PROLACTIN" No results found for: "CHOL", "TRIG", "HDL", "CHOLHDL", "VLDL", "LDLCALC"  Physical Findings: AIMS:  , ,  ,  ,    CIWA:    COWS:     Musculoskeletal: Strength & Muscle Tone: within normal limits Gait & Station: normal Patient leans: N/A  Psychiatric Specialty Exam:  Presentation  General Appearance:  Appropriate for Environment; Casual  Eye Contact: Good  Speech: Clear and Coherent  Speech Volume: Normal  Handedness: Right   Mood and Affect  Mood: Anxious  Affect: Depressed   Thought Process  Thought Processes: Coherent  Descriptions of Associations:Intact  Orientation:Full (Time, Place and Person)  Thought  Content:WDL (History of some paranoid thoughts recently)  History of Schizophrenia/Schizoaffective disorder:No  Duration of Psychotic Symptoms:No data recorded Hallucinations:No data recorded Ideas of Reference:Paranoia  Suicidal Thoughts:No data recorded Homicidal Thoughts:No data recorded  Sensorium  Memory: Immediate Good  Judgment: Fair  Insight: Fair  Executive Functions  Concentration: Fair  Attention Span: Fair  Recall: Jennelle Human of Knowledge:No data recorded Language: Fair   Lexicographer Activity:No data recorded  Assets  Assets: Manufacturing systems engineer; Desire for Improvement; Financial Resources/Insurance; Housing; Social Support; Resilience; Physical Health   Sleep  Sleep:No data recorded   Physical Exam: Physical Exam Vitals and nursing note reviewed.  Constitutional:      Appearance: Normal appearance. She is normal weight.  Neurological:     General: No focal deficit present.     Mental Status: She is alert and oriented to person, place, and time.  Psychiatric:        Attention and Perception: Attention and perception normal.        Mood and Affect: Affect normal. Mood is anxious.        Speech: Speech normal.        Behavior: Behavior normal. Behavior is cooperative.        Thought Content: Thought content normal.        Cognition and Memory: Cognition and memory normal.        Judgment: Judgment normal.    Review of Systems  Constitutional: Negative.   HENT: Negative.    Eyes: Negative.   Respiratory: Negative.    Cardiovascular: Negative.   Gastrointestinal: Negative.   Genitourinary: Negative.   Musculoskeletal: Negative.   Skin: Negative.   Neurological: Negative.   Endo/Heme/Allergies: Negative.   Psychiatric/Behavioral:  Positive for depression. The patient is nervous/anxious.    Blood pressure (!) 133/101, pulse 76, temperature 98.5 F (36.9 C), resp. rate 15, height 5\' 3"  (1.6 m), weight 61.2  kg, SpO2 100 %. Body mass index is 23.91 kg/m.   Treatment Plan Summary: Daily contact with patient to assess and evaluate symptoms and progress in treatment, Medication management, and Plan increase Risperdal to 0.5 mg twice a day and 1 at bedtime.  Lab work in the morning which includes a A1c and lipid profile.  Sarina Ill, DO 03/28/2023, 11:52 AM

## 2023-03-29 LAB — LIPID PANEL
Cholesterol: 97 mg/dL (ref 0–200)
HDL: 42 mg/dL (ref >40)
LDL Cholesterol: 40 mg/dL (ref 0–99)
Total CHOL/HDL Ratio: 2.3 RATIO
Triglycerides: 75 mg/dL (ref <150)
VLDL: 15 mg/dL (ref 0–40)

## 2023-03-29 LAB — HEMOGLOBIN A1C
Hgb A1c MFr Bld: 7.1 % — ABNORMAL HIGH (ref 4.8–5.6)
Mean Plasma Glucose: 157 mg/dL

## 2023-03-29 NOTE — Progress Notes (Signed)
[facility] MD Progress Note  03/29/2023 1:40 PM Miranda Delgado  MRN:  [id number] Subjective: Miranda Delgado is seen on rounds.  She is doing much better since increasing her Risperdal to half milligram twice a day and 1 at bedtime.  She says that she feels better.  She has been compliant with medications and denies any side effects.  She is doing well and we plan on discharge on Friday. Principal Problem: MDD (major depressive disorder) Diagnosis: Principal Problem:   MDD (major depressive disorder)  Total Time spent with patient: 15 minutes  Past Psychiatric History: History of depression.  Past Medical History:  Past Medical History:  Diagnosis Date   Diabetes mellitus without complication (HCC)    Hypertension     Past Surgical History:  Procedure Laterality Date   CESAREAN SECTION     COLONOSCOPY WITH PROPOFOL N/A 09/19/2022   Procedure: COLONOSCOPY WITH PROPOFOL;  Surgeon: Jaynie Collins, DO;  Location: [facility] ENDOSCOPY;  Service: Gastroenterology;  Laterality: N/A;   TONSILLECTOMY     Family History: History reviewed. No pertinent family history. Family Psychiatric  History: Unremarkable Social History:  Social History   Substance and Sexual Activity  Alcohol Use Not Currently     Social History   Substance and Sexual Activity  Drug Use Not Currently    Social History   Socioeconomic History   Marital status: Married    Spouse name: Not on file   Number of children: Not on file   Years of education: Not on file   Highest education level: Not on file  Occupational History   Not on file  Tobacco Use   Smoking status: Never   Smokeless tobacco: Never  Vaping Use   Vaping Use: Never used  Substance and Sexual Activity   Alcohol use: Not Currently   Drug use: Not Currently   Sexual activity: Not Currently  Other Topics Concern   Not on file  Social History Narrative   Not on file   Social Determinants of Health   Financial Resource Strain: Not on file  Food  Insecurity: No Food Insecurity (03/23/2023)   Hunger Vital Sign    Worried About Running Out of Food in the Last Year: Never true    Ran Out of Food in the Last Year: Never true  Transportation Needs: No Transportation Needs (03/23/2023)   PRAPARE - Administrator, Civil Service (Medical): No    Lack of Transportation (Non-Medical): No  Physical Activity: Not on file  Stress: Not on file  Social Connections: Not on file   Additional Social History:                         Sleep: Good  Appetite:  Good  Current Medications: Current Facility-Administered Medications  Medication Dose Route Frequency Provider Last Rate Last Admin   acetaminophen (TYLENOL) tablet 650 mg  650 mg Oral Q6H PRN Vanetta Mulders, NP       alum & mag hydroxide-simeth (MAALOX/MYLANTA) 200-200-20 MG/5ML suspension 30 mL  30 mL Oral Q4H PRN Gabriel Cirri F, NP       amLODipine (NORVASC) tablet 10 mg  10 mg Oral Daily Gabriel Cirri F, NP   10 mg at 03/29/23 4098   aspirin EC tablet 81 mg  81 mg Oral q1800 Sarina Ill, DO   81 mg at 03/28/23 1644   cyanocobalamin (VITAMIN B12) tablet 1,000 mcg  1,000 mcg Oral Daily Gabriel Cirri F,  NP   1,000 mcg at 03/29/23 4098   diphenhydrAMINE (BENADRYL) capsule 50 mg  50 mg Oral TID PRN Vanetta Mulders, NP       Or   diphenhydrAMINE (BENADRYL) injection 50 mg  50 mg Intramuscular TID PRN Vanetta Mulders, NP       FLUoxetine (PROZAC) capsule 20 mg  20 mg Oral q morning Gabriel Cirri F, NP   20 mg at 03/29/23 1191   haloperidol (HALDOL) tablet 5 mg  5 mg Oral TID PRN Vanetta Mulders, NP       Or   haloperidol lactate (HALDOL) injection 5 mg  5 mg Intramuscular TID PRN Vanetta Mulders, NP       iron polysaccharides (NIFEREX) capsule 150 mg  150 mg Oral Daily Gabriel Cirri F, NP   150 mg at 03/29/23 0904   lisinopril (ZESTRIL) tablet 40 mg  40 mg Oral Daily Gabriel Cirri F, NP   40 mg at 03/29/23 4782   LORazepam  (ATIVAN) tablet 2 mg  2 mg Oral TID PRN Vanetta Mulders, NP       Or   LORazepam (ATIVAN) injection 2 mg  2 mg Intramuscular TID PRN Vanetta Mulders, NP       magnesium hydroxide (MILK OF MAGNESIA) suspension 30 mL  30 mL Oral Daily PRN Vanetta Mulders, NP       magnesium oxide (MAG-OX) tablet 400 mg  400 mg Oral Daily Gabriel Cirri F, NP   400 mg at 03/29/23 9562   metFORMIN (GLUCOPHAGE) tablet 500 mg  500 mg Oral BID WC Gabriel Cirri F, NP   500 mg at 03/29/23 0902   risperiDONE (RISPERDAL) tablet 0.5 mg  0.5 mg Oral BH-q8a4p Sarina Ill, DO   0.5 mg at 03/29/23 0902   risperiDONE (RISPERDAL) tablet 1 mg  1 mg Oral QHS Gabriel Cirri F, NP   1 mg at 03/28/23 2108   Semaglutide(0.25 or 0.5MG /DOS) Namon Cirri 0.25 mg  0.25 mg Subcutaneous Weekly Sharen Hones, RPH   0.25 mg at 03/26/23 1308   simvastatin (ZOCOR) tablet 20 mg  20 mg Oral q1800 Sarina Ill, DO   20 mg at 03/28/23 1641    Lab Results:  Results for orders placed or performed during the hospital encounter of 03/23/23 (from the past 48 hour(s))  Lipid panel     Status: None   Collection Time: 03/29/23  7:08 AM  Result Value Ref Range   Cholesterol 97 0 - 200 mg/dL   Triglycerides 75 <657 mg/dL   HDL 42 >84 mg/dL   Total CHOL/HDL Ratio 2.3 RATIO   VLDL 15 0 - 40 mg/dL   LDL Cholesterol 40 0 - 99 mg/dL    Comment:        Total Cholesterol/HDL:CHD Risk Coronary Heart Disease Risk Table                     Men   Women  1/2 Average Risk   3.4   3.3  Average Risk       5.0   4.4  2 X Average Risk   9.6   7.1  3 X Average Risk  23.4   11.0        Use the calculated Patient Ratio above and the CHD Risk Table to determine the patient's CHD Risk.        ATP III CLASSIFICATION (LDL):  <100     mg/dL  Optimal  100-129  mg/dL   Near or Above                    Optimal  130-159  mg/dL   Borderline  161-096  mg/dL   High  >045     mg/dL   Very High Performed at [facility], [address] [place], [place] [address]     Blood Alcohol level:  Lab Results  Component Value Date   [facility] <5 07/15/2017    Metabolic Disorder Labs: No results found for: "HGBA1C", "MPG" No results found for: "PROLACTIN" Lab Results  Component Value Date   CHOL 97 03/29/2023   TRIG 75 03/29/2023   HDL 42 03/29/2023   CHOLHDL 2.3 03/29/2023   VLDL 15 03/29/2023   LDLCALC 40 03/29/2023    Physical Findings: AIMS:  , ,  ,  ,    CIWA:    COWS:     Musculoskeletal: Strength & Muscle Tone: within normal limits Gait & Station: normal Patient leans: N/A  Psychiatric Specialty Exam:  Presentation  General Appearance:  Appropriate for Environment; Casual  Eye Contact: Good  Speech: Clear and Coherent  Speech Volume: Normal  Handedness: Right   Mood and Affect  Mood: Anxious  Affect: Depressed   Thought Process  Thought Processes: Coherent  Descriptions of Associations:Intact  Orientation:Full (Time, Place and Person)  Thought Content:WDL (History of some paranoid thoughts recently)  History of Schizophrenia/Schizoaffective disorder:No  Duration of Psychotic Symptoms:No data recorded Hallucinations:No data recorded Ideas of Reference:Paranoia  Suicidal Thoughts:No data recorded Homicidal Thoughts:No data recorded  Sensorium  Memory: Immediate Good  Judgment: Fair  Insight: Fair   Art therapist  Concentration: Fair  Attention Span: Fair  Recall: [employer] of Knowledge:No data recorded Language: Fair   Psychomotor Activity  Psychomotor Activity:No data recorded  Assets  Assets: Manufacturing systems engineer; Desire for Improvement; Financial Resources/Insurance; Housing; Social Support; Resilience; Physical Health   Sleep  Sleep:No data recorded   Physical Exam: Physical Exam Vitals and nursing note reviewed.  Constitutional:      Appearance: Normal appearance. She is normal weight.  Neurological:     General:  No focal deficit present.     Mental Status: She is alert and oriented to person, place, and time.  Psychiatric:        Attention and Perception: Attention and perception normal.        Mood and Affect: Mood and affect normal.        Speech: Speech normal.        Behavior: Behavior normal. Behavior is cooperative.        Thought Content: Thought content normal.        Cognition and Memory: Cognition and memory normal.        Judgment: Judgment normal.    Review of Systems  Constitutional: Negative.   HENT: Negative.    Eyes: Negative.   Respiratory: Negative.    Cardiovascular: Negative.   Gastrointestinal: Negative.   Genitourinary: Negative.   Musculoskeletal: Negative.   Skin: Negative.   Neurological: Negative.   Endo/Heme/Allergies: Negative.   Psychiatric/Behavioral: Negative.     Blood pressure (!) 120/57, pulse 81, temperature (!) 97.3 F (36.3 C), resp. rate 15, height 5\' 3"  (1.6 m), weight 61.2 kg, SpO2 100 %. Body mass index is 23.91 kg/m.   Treatment Plan Summary: Daily contact with patient to assess and evaluate symptoms and progress in treatment, Medication management, and Plan continue current medications.  Vaanya Shambaugh Tresea Mall, DO 03/29/2023, 1:40 PM

## 2023-03-29 NOTE — Group Note (Signed)
Recreation Therapy Group Note   Group Topic:Problem Solving  Group Date: 03/29/2023 Start Time: 1400 End Time: 1455 Facilitators: Rosina Lowenstein, LRT, CTRS Location:  Craft Room  Group Description: Life Boat. Patients were given the scenario that they are on a boat that is about to become shipwrecked, leaving them stranded on an [place]. They are asked to make a list of 15 different items that they want to take with them when they are stranded on the [place]. Patients are asked to rank their items from most important to least important, #1 being the most important and #15 being the least. Patients will work individually for the first round to come up with 15 items and then pair up with a peer(s) to condense their list and come up with one list of 15 items between the two of them. Patients or LRT will read aloud the 15 different items to the group after each round. LRT facilitated post-activity processing to discuss how this activity can be used in daily life post discharge.   Goal Area(s) Addressed:  Patient will identify priorities, wants and needs. Patient will communicate with LRT and peers. Patient will work collectively as a Administrator, Civil Service. Patient will work on Product manager.   Affect/Mood: Appropriate   Participation Level: Active and Engaged   Participation Quality: Independent   Behavior: Appropriate   Speech/Thought Process: Coherent   Insight: Good   Judgement: Good   Modes of Intervention: Activity   Patient Response to Interventions:  Attentive, Engaged, Interested , and Receptive   Education Outcome:  Acknowledges education   Clinical Observations/Individualized Feedback: Anisty was active in their participation of session activities and group discussion. Pt identified "matches, gloves, flares, food, water, medicine, and bug spray" as some of the items she will bring with her on the [place]. Pt interacted well with LRT and peers duration of session.   Plan:  Continue to engage patient in RT group sessions 2-3x/week.   Rosina Lowenstein, LRT, CTRS 03/29/2023 4:02 PM

## 2023-03-29 NOTE — Group Note (Signed)
Date:  03/29/2023 Time:  10:23 PM  Group Topic/Focus:  Coping With Mental Health Crisis:   The purpose of this group is to help patients identify strategies for coping with mental health crisis.  Group discusses possible causes of crisis and ways to manage them effectively.    Participation Level:  Active  Participation Quality:  Appropriate  Affect:  Appropriate  Cognitive:  Appropriate  Insight: Good  Engagement in Group:  Engaged  Modes of Intervention:  Confrontation  Additional Comments:    Garry Heater 03/29/2023, 10:23 PM

## 2023-03-29 NOTE — Group Note (Signed)
Date:  03/29/2023 Time:  6:46 PM  Group Topic/Focus:  Personal Choices and Values:   The focus of this group is to help patients assess and explore the importance of values in their lives, how their values affect their decisions, how they express their values and what opposes their expression.    Participation Level:  Active  Participation Quality:  Appropriate  Affect:  Appropriate  Cognitive:  Alert and Appropriate  Insight: Appropriate, Good, and Improving  Engagement in Group:  Engaged  Modes of Intervention:  Activity and Discussion  Additional Comments:    Doug Sou 03/29/2023, 6:46 PM

## 2023-03-29 NOTE — Progress Notes (Signed)
   03/29/23 1100  Psych Admission Type (Psych Patients Only)  Admission Status Voluntary  Psychosocial Assessment  Patient Complaints None  Eye Contact Fair  Facial Expression Flat  Affect Flat  Speech Logical/coherent  Interaction Assertive  Motor Activity Slow  Appearance/Hygiene Unremarkable  Behavior Characteristics Cooperative;Calm  Mood Pleasant  Thought Process  Coherency WDL  Content WDL  Delusions None reported or observed  Perception WDL  Hallucination None reported or observed  Judgment WDL  Confusion None  Danger to Self  Current suicidal ideation? Denies  Agreement Not to Harm Self Yes  Danger to Others  Danger to Others None reported or observed   Patient spent time in dayroom with peer interaction today. Attended all groups. Denies anxiety, SI/HI/AVH. Tolerated all medications and meals.

## 2023-03-29 NOTE — BH IP Treatment Plan (Signed)
Interdisciplinary Treatment and Diagnostic Plan Update  03/29/2023 Time of Session: 9:52 AM Miranda Delgado MRN: [id number]  Principal Diagnosis: MDD (major depressive disorder)  Secondary Diagnoses: Principal Problem:   MDD (major depressive disorder)   Current Medications:  Current Facility-Administered Medications  Medication Dose Route Frequency Provider Last Rate Last Admin   acetaminophen (TYLENOL) tablet 650 mg  650 mg Oral Q6H PRN Vanetta Mulders, NP       alum & mag hydroxide-simeth (MAALOX/MYLANTA) 200-200-20 MG/5ML suspension 30 mL  30 mL Oral Q4H PRN Vanetta Mulders, NP       amLODipine (NORVASC) tablet 10 mg  10 mg Oral Daily Gabriel Cirri F, NP   10 mg at 03/29/23 4098   aspirin EC tablet 81 mg  81 mg Oral q1800 Sarina Ill, DO   81 mg at 03/28/23 1644   cyanocobalamin (VITAMIN B12) tablet 1,000 mcg  1,000 mcg Oral Daily Gabriel Cirri F, NP   1,000 mcg at 03/29/23 1191   diphenhydrAMINE (BENADRYL) capsule 50 mg  50 mg Oral TID PRN Vanetta Mulders, NP       Or   diphenhydrAMINE (BENADRYL) injection 50 mg  50 mg Intramuscular TID PRN Vanetta Mulders, NP       FLUoxetine (PROZAC) capsule 20 mg  20 mg Oral q morning Gabriel Cirri F, NP   20 mg at 03/29/23 4782   haloperidol (HALDOL) tablet 5 mg  5 mg Oral TID PRN Vanetta Mulders, NP       Or   haloperidol lactate (HALDOL) injection 5 mg  5 mg Intramuscular TID PRN Vanetta Mulders, NP       iron polysaccharides (NIFEREX) capsule 150 mg  150 mg Oral Daily Gabriel Cirri F, NP   150 mg at 03/29/23 0904   lisinopril (ZESTRIL) tablet 40 mg  40 mg Oral Daily Gabriel Cirri F, NP   40 mg at 03/29/23 9562   LORazepam (ATIVAN) tablet 2 mg  2 mg Oral TID PRN Vanetta Mulders, NP       Or   LORazepam (ATIVAN) injection 2 mg  2 mg Intramuscular TID PRN Vanetta Mulders, NP       magnesium hydroxide (MILK OF MAGNESIA) suspension 30 mL  30 mL Oral Daily PRN Gabriel Cirri F, NP        magnesium oxide (MAG-OX) tablet 400 mg  400 mg Oral Daily Gabriel Cirri F, NP   400 mg at 03/29/23 0902   metFORMIN (GLUCOPHAGE) tablet 500 mg  500 mg Oral BID WC Gabriel Cirri F, NP   500 mg at 03/29/23 0902   risperiDONE (RISPERDAL) tablet 0.5 mg  0.5 mg Oral [id number] Sarina Ill, DO   0.5 mg at 03/29/23 0902   risperiDONE (RISPERDAL) tablet 1 mg  1 mg Oral QHS Gabriel Cirri F, NP   1 mg at 03/28/23 2108   Semaglutide(0.25 or 0.5MG /DOS) SOPN 0.25 mg  0.25 mg Subcutaneous Weekly Sharen Hones, RPH   0.25 mg at 03/26/23 0951   simvastatin (ZOCOR) tablet 20 mg  20 mg Oral q1800 Sarina Ill, DO   20 mg at 03/28/23 1641   PTA Medications: Medications Prior to Admission  Medication Sig Dispense Refill Last Dose   amLODipine (NORVASC) 10 MG tablet Take 10 mg by mouth daily.      aspirin EC 81 MG tablet Take 81 mg by mouth daily.      cyanocobalamin (VITAMIN B12) 1000 MCG tablet Take  1,000 mcg by mouth daily.      FERREX 150 150 MG capsule Take 150 mg by mouth daily.      FLUoxetine (PROZAC) 20 MG capsule Take 20 mg by mouth daily.      HUMALOG KWIKPEN 100 UNIT/ML KiwkPen Inject 10 Units into the skin daily. Take with largest meal of the day. (Patient not taking: Reported on 09/19/2022)      LEVEMIR FLEXTOUCH 100 UNIT/ML Pen Inject 50 Units into the skin at bedtime. (Patient not taking: Reported on 09/19/2022)      lisinopril (PRINIVIL,ZESTRIL) 30 MG tablet Take 30 mg by mouth daily.      magnesium oxide (MAG-OX) 400 (240 Mg) MG tablet Take 400 mg by mouth daily.      metFORMIN (GLUCOPHAGE) 500 MG tablet Take 500 mg by mouth 2 (two) times daily.      metoprolol tartrate (LOPRESSOR) 25 MG tablet Take 1 tablet (25 mg total) by mouth 2 (two) times daily for 15 days. 30 tablet 0    omeprazole (PRILOSEC) 20 MG capsule Take 20 mg by mouth daily. (Patient not taking: Reported on 09/19/2022)      SEMAGLUTIDE,0.25 OR 0.5MG /DOS, [place] Inject into the skin.      simvastatin  (ZOCOR) 20 MG tablet Take 20 mg by mouth daily.       Patient Stressors: Medication change or noncompliance    Patient Strengths: Ability for insight  Capable of independent living  Communication skills  Supportive family/friends   Treatment Modalities: Medication Management, Group therapy, Case management,  1 to 1 session with clinician, Psychoeducation, Recreational therapy.   Physician Treatment Plan for Primary Diagnosis: MDD (major depressive disorder) Long Term Goal(s): Improvement in symptoms so as ready for discharge   Short Term Goals: Ability to identify changes in lifestyle to reduce recurrence of condition will improve Ability to verbalize feelings will improve Ability to disclose and discuss suicidal ideas Ability to demonstrate self-control will improve Ability to identify and develop effective coping behaviors will improve Ability to maintain clinical measurements within normal limits will improve Compliance with prescribed medications will improve Ability to identify triggers associated with substance abuse/mental health issues will improve  Medication Management: Evaluate patient's response, side effects, and tolerance of medication regimen.  Therapeutic Interventions: 1 to 1 sessions, Unit Group sessions and Medication administration.  Evaluation of Outcomes: Progressing  Physician Treatment Plan for Secondary Diagnosis: Principal Problem:   MDD (major depressive disorder)  Long Term Goal(s): Improvement in symptoms so as ready for discharge   Short Term Goals: Ability to identify changes in lifestyle to reduce recurrence of condition will improve Ability to verbalize feelings will improve Ability to disclose and discuss suicidal ideas Ability to demonstrate self-control will improve Ability to identify and develop effective coping behaviors will improve Ability to maintain clinical measurements within normal limits will improve Compliance with prescribed  medications will improve Ability to identify triggers associated with substance abuse/mental health issues will improve     Medication Management: Evaluate patient's response, side effects, and tolerance of medication regimen.  Therapeutic Interventions: 1 to 1 sessions, Unit Group sessions and Medication administration.  Evaluation of Outcomes: Progressing   RN Treatment Plan for Primary Diagnosis: MDD (major depressive disorder) Long Term Goal(s): Knowledge of disease and therapeutic regimen to maintain health will improve  Short Term Goals: Ability to demonstrate self-control, Ability to participate in decision making will improve, Ability to verbalize feelings will improve, Ability to disclose and discuss suicidal ideas, Ability to identify and  develop effective coping behaviors will improve, and Compliance with prescribed medications will improve  Medication Management: RN will administer medications as ordered by provider, will assess and evaluate patient's response and provide education to patient for prescribed medication. RN will report any adverse and/or side effects to prescribing provider.  Therapeutic Interventions: 1 on 1 counseling sessions, Psychoeducation, Medication administration, Evaluate responses to treatment, Monitor vital signs and CBGs as ordered, Perform/monitor CIWA, COWS, AIMS and Fall Risk screenings as ordered, Perform wound care treatments as ordered.  Evaluation of Outcomes: Progressing   LCSW Treatment Plan for Primary Diagnosis: MDD (major depressive disorder) Long Term Goal(s): Safe transition to appropriate next level of care at discharge, Engage patient in therapeutic group addressing interpersonal concerns.  Short Term Goals: Engage patient in aftercare planning with referrals and resources, Increase social support, Increase ability to appropriately verbalize feelings, Increase emotional regulation, Facilitate acceptance of mental health diagnosis and  concerns, and Increase skills for wellness and recovery  Therapeutic Interventions: Assess for all discharge needs, 1 to 1 time with Social worker, Explore available resources and support systems, Assess for adequacy in community support network, Educate family and significant other(s) on suicide prevention, Complete Psychosocial Assessment, Interpersonal group therapy.  Evaluation of Outcomes: Progressing   Progress in Treatment: Attending groups: Yes. Participating in groups: Yes. Taking medication as prescribed: Yes. Toleration medication: Yes. Family/Significant other contact made: Yes, individual(s) contacted:  SPE completed with the patient's husband Patient understands diagnosis: Yes. Discussing patient identified problems/goals with staff: Yes. Medical problems stabilized or resolved: Yes. Denies suicidal/homicidal ideation: Yes. Issues/concerns per patient self-inventory: No. Other: none  New problem(s) identified: No, Describe:  none  Update 03/29/2023: No changes at this time.    New Short Term/Long Term Goal(s): elimination of symptoms of psychosis, medication management for mood stabilization; elimination of SI thoughts; development of comprehensive mental wellness/sobriety plan. Update 03/29/2023: No changes at this time.    Patient Goals:  "Get better.  Not regress back to where I am now.  Move forward and keep moving forward."  Update 03/29/2023: No changes at this time.    Discharge Plan or Barriers:  CSW to assist with appropriate discharge plans. Update 03/29/2023: Patient reports plans to return to her home.  She has Dr. Maryruth Bun as her current medication provider and plans to continue with her provider.    Reason for Continuation of Hospitalization: Anxiety Delusions  Depression Medication stabilization Suicidal ideation   Estimated Length of Stay:  1-7 days  Update 03/29/2023: TBD   Last 3 [place] Suicide Severity Risk Score: Flowsheet Row Admission (Current) from  03/23/2023 in [facility] Most recent reading at 03/23/2023  6:51 PM ED from 03/23/2023 in [facility] Emergency Department at [facility] Most recent reading at 03/23/2023 11:13 AM ED from 03/23/2023 in [facility] Emergency Department at [facility] Most recent reading at 03/23/2023  4:27 AM  C-SSRS RISK CATEGORY No Risk No Risk No Risk       Last PHQ 2/9 Scores:     No data to display          Scribe for Treatment Team: Harden Mo, LCSW 03/29/2023 9:52 AM

## 2023-03-29 NOTE — Progress Notes (Signed)
Patient A&Ox4. Patient denies SI/Hi and AVH. Denies any physical complaints when asked. No acute distress noted. Support and encouragement provided. Routine safety checks conducted according to facility protocol. Encouraged patient to notify staff if thoughts of harm toward self or others arise. Patient verbalize understanding and agreement. Will continue to monitor for safety. 

## 2023-03-29 NOTE — Group Note (Signed)
[facility] LCSW Group Therapy Note   Group Date: 03/29/2023 Start Time: 1315 End Time: 1400   Type of Therapy/Topic:  Group Therapy:  Emotion Regulation  Participation Level:  Active   Mood:  Description of Group:    The purpose of this group is to assist patients in learning to regulate negative emotions and experience positive emotions. Patients will be guided to discuss ways in which they have been vulnerable to their negative emotions. These vulnerabilities will be juxtaposed with experiences of positive emotions or situations, and patients challenged to use positive emotions to combat negative ones. Special emphasis will be placed on coping with negative emotions in conflict situations, and patients will process healthy conflict resolution skills.  Therapeutic Goals: Patient will identify two positive emotions or experiences to reflect on in order to balance out negative emotions:  Patient will label two or more emotions that they find the most difficult to experience:  Patient will be able to demonstrate positive conflict resolution skills through discussion or role plays:   Summary of Patient Progress: Patient was present in group.  Patient was an active participant.  Patient was able to engage in discussions on the skills discussed and when best to use the skills.  Patient was appropriate and supportive of other group members.   Therapeutic Modalities:   Cognitive Behavioral Therapy Feelings Identification Dialectical Behavioral Therapy   Harden Mo, LCSW

## 2023-03-30 NOTE — Plan of Care (Signed)
D Pt is pleasant and cooperative. Affect is cheerful.Pt is preparing for discharge tomorrow. Pt ate in dining area and is social with peers. Eye contact is good. A Scheduled medications given. Encouraged pt to come to staff with any concerns or needs. Pt reports anxiety is improving R Pt stated that she has no suicidal or homicidal thoughts. Pt agrees that she can come to staff if she has any s/I or h/I. Pt is future oriented and looking forward to discharge tomorrow.

## 2023-03-30 NOTE — Progress Notes (Addendum)
Pt is alert and oriented x4. Pt has been pleasant and cooperative today,she smiles easily upon approach. Eye contact is good. Pt is appropriately social with peers,pleasant with staff. Pt denies any suicidal or homicidal ideations. Pt stated she can come to staff if she would feel like hurting herself/others. Pt is compliant with medications. Vital signs have been stable, blood pressure was low this morning,so am blood pressure medications held.Affect is bright,pt is looking forward to going home tomorrow.Safety checks maintained per protocol every 15 minutes.Pt has attended group today and walked in the hall for exercise. Pt had no complaints of pain or any other distress. She denies any AVH.

## 2023-03-30 NOTE — [facility] Group Notes (Signed)
[facility] Group Notes:  (Nursing/MHT/Case Management/Adjunct)  Date:  03/30/2023  Time:  10:13 AM  Type of Therapy:  Music Therapy  Participation Level:  Active  Participation Quality:  Appropriate  Affect:  Appropriate  Cognitive:  Appropriate  Insight:  Appropriate  Engagement in Group:  Engaged  Modes of Intervention:  Activity and Discussion  Summary of Progress/Problems:  Miranda Delgado 03/30/2023, 10:13 AM

## 2023-03-30 NOTE — Progress Notes (Signed)
   03/30/23 2000  Psych Admission Type (Psych Patients Only)  Admission Status Voluntary  Psychosocial Assessment  Patient Complaints None  Eye Contact Fair  Facial Expression Other (Comment) (appropriate)  Affect Appropriate to circumstance  Speech Logical/coherent  Interaction Assertive  Motor Activity Other (Comment) (WDL)  Appearance/Hygiene Unremarkable  Behavior Characteristics Cooperative;Appropriate to situation  Mood Pleasant  Thought Process  Coherency WDL  Content WDL  Delusions None reported or observed  Perception WDL  Hallucination None reported or observed  Judgment WDL  Confusion None  Danger to Self  Current suicidal ideation? Denies  Agreement Not to Harm Self Yes  Description of Agreement verbal  Danger to Others  Danger to Others None reported or observed

## 2023-03-30 NOTE — Progress Notes (Signed)
[facility] MD Progress Note  03/30/2023 11:45 AM Miranda Delgado  MRN:  [id number] Subjective: Miranda Delgado is seen on rounds.  She says that she is doing well.  She is taking her medications as prescribed and denies any side effects.  We talked about discharge tomorrow.  She says that her husband will pick her up.  She follows up with Dr. Maryruth Bun. Principal Problem: MDD (major depressive disorder) Diagnosis: Principal Problem:   MDD (major depressive disorder)  Total Time spent with patient: 15 minutes  Past Psychiatric History: History of depression  Past Medical History:  Past Medical History:  Diagnosis Date   Diabetes mellitus without complication (HCC)    Hypertension     Past Surgical History:  Procedure Laterality Date   CESAREAN SECTION     COLONOSCOPY WITH PROPOFOL N/A 09/19/2022   Procedure: COLONOSCOPY WITH PROPOFOL;  Surgeon: Jaynie Collins, DO;  Location: [facility] ENDOSCOPY;  Service: Gastroenterology;  Laterality: N/A;   TONSILLECTOMY     Family History: History reviewed. No pertinent family history. Family Psychiatric  History: Unremarkable Social History:  Social History   Substance and Sexual Activity  Alcohol Use Not Currently     Social History   Substance and Sexual Activity  Drug Use Not Currently    Social History   Socioeconomic History   Marital status: Married    Spouse name: Not on file   Number of children: Not on file   Years of education: Not on file   Highest education level: Not on file  Occupational History   Not on file  Tobacco Use   Smoking status: Never   Smokeless tobacco: Never  Vaping Use   Vaping Use: Never used  Substance and Sexual Activity   Alcohol use: Not Currently   Drug use: Not Currently   Sexual activity: Not Currently  Other Topics Concern   Not on file  Social History Narrative   Not on file   Social Determinants of Health   Financial Resource Strain: Not on file  Food Insecurity: No Food Insecurity  (03/23/2023)   Hunger Vital Sign    Worried About Running Out of Food in the Last Year: Never true    Ran Out of Food in the Last Year: Never true  Transportation Needs: No Transportation Needs (03/23/2023)   PRAPARE - Administrator, Civil Service (Medical): No    Lack of Transportation (Non-Medical): No  Physical Activity: Not on file  Stress: Not on file  Social Connections: Not on file   Additional Social History:                         Sleep: Good  Appetite:  Good  Current Medications: Current Facility-Administered Medications  Medication Dose Route Frequency Provider Last Rate Last Admin   acetaminophen (TYLENOL) tablet 650 mg  650 mg Oral Q6H PRN Vanetta Mulders, NP       alum & mag hydroxide-simeth (MAALOX/MYLANTA) 200-200-20 MG/5ML suspension 30 mL  30 mL Oral Q4H PRN Gabriel Cirri F, NP       amLODipine (NORVASC) tablet 10 mg  10 mg Oral Daily Gabriel Cirri F, NP   10 mg at 03/29/23 4782   aspirin EC tablet 81 mg  81 mg Oral q1800 Sarina Ill, DO   81 mg at 03/29/23 1627   cyanocobalamin (VITAMIN B12) tablet 1,000 mcg  1,000 mcg Oral Daily Gabriel Cirri F, NP   1,000 mcg at 03/30/23  1022   diphenhydrAMINE (BENADRYL) capsule 50 mg  50 mg Oral TID PRN Vanetta Mulders, NP       Or   diphenhydrAMINE (BENADRYL) injection 50 mg  50 mg Intramuscular TID PRN Vanetta Mulders, NP       FLUoxetine (PROZAC) capsule 20 mg  20 mg Oral q morning Gabriel Cirri F, NP   20 mg at 03/30/23 1022   haloperidol (HALDOL) tablet 5 mg  5 mg Oral TID PRN Vanetta Mulders, NP       Or   haloperidol lactate (HALDOL) injection 5 mg  5 mg Intramuscular TID PRN Vanetta Mulders, NP       iron polysaccharides (NIFEREX) capsule 150 mg  150 mg Oral Daily Gabriel Cirri F, NP   150 mg at 03/30/23 1022   lisinopril (ZESTRIL) tablet 40 mg  40 mg Oral Daily Gabriel Cirri F, NP   40 mg at 03/29/23 1610   LORazepam (ATIVAN) tablet 2 mg  2 mg Oral TID  PRN Vanetta Mulders, NP       Or   LORazepam (ATIVAN) injection 2 mg  2 mg Intramuscular TID PRN Vanetta Mulders, NP       magnesium hydroxide (MILK OF MAGNESIA) suspension 30 mL  30 mL Oral Daily PRN Vanetta Mulders, NP       magnesium oxide (MAG-OX) tablet 400 mg  400 mg Oral Daily Gabriel Cirri F, NP   400 mg at 03/30/23 1021   metFORMIN (GLUCOPHAGE) tablet 500 mg  500 mg Oral BID WC Gabriel Cirri F, NP   500 mg at 03/30/23 0803   risperiDONE (RISPERDAL) tablet 0.5 mg  0.5 mg Oral [id number] Sarina Ill, DO   0.5 mg at 03/30/23 0803   risperiDONE (RISPERDAL) tablet 1 mg  1 mg Oral QHS Gabriel Cirri F, NP   1 mg at 03/29/23 2112   Semaglutide(0.25 or 0.5MG /DOS) SOPN 0.25 mg  0.25 mg Subcutaneous Weekly Sharen Hones, RPH   0.25 mg at 03/26/23 9604   simvastatin (ZOCOR) tablet 20 mg  20 mg Oral q1800 Sarina Ill, DO   20 mg at 03/29/23 1627    Lab Results:  Results for orders placed or performed during the hospital encounter of 03/23/23 (from the past 48 hour(s))  Hemoglobin A1c     Status: Abnormal   Collection Time: 03/29/23  7:08 AM  Result Value Ref Range   Hgb A1c MFr Bld 7.1 (H) 4.8 - 5.6 %    Comment: (NOTE)         Prediabetes: 5.7 - 6.4         Diabetes: >6.4         Glycemic control for adults with diabetes: <7.0    Mean Plasma Glucose 157 mg/dL    Comment: (NOTE) Performed At: [facility] [address] [place], [place] [id number] Jolene Schimke MD YN:[id number]   Lipid panel     Status: None   Collection Time: 03/29/23  7:08 AM  Result Value Ref Range   Cholesterol 97 0 - 200 mg/dL   Triglycerides 75 <657 mg/dL   HDL 42 >84 mg/dL   Total CHOL/HDL Ratio 2.3 RATIO   VLDL 15 0 - 40 mg/dL   LDL Cholesterol 40 0 - 99 mg/dL    Comment:        Total Cholesterol/HDL:CHD Risk Coronary Heart Disease Risk Table  Men   Women  1/2 Average Risk   3.4   3.3  Average Risk       5.0   4.4  2 X Average Risk    9.6   7.1  3 X Average Risk  23.4   11.0        Use the calculated Patient Ratio above and the CHD Risk Table to determine the patient's CHD Risk.        ATP III CLASSIFICATION (LDL):  <100     mg/dL   Optimal  811-914  mg/dL   Near or Above                    Optimal  130-159  mg/dL   Borderline  782-956  mg/dL   High  >213     mg/dL   Very High Performed at [facility], [address] [place], [place] [address]     Blood Alcohol level:  Lab Results  Component Value Date   [facility] <5 07/15/2017    Metabolic Disorder Labs: Lab Results  Component Value Date   HGBA1C 7.1 (H) 03/29/2023   MPG 157 03/29/2023   No results found for: "PROLACTIN" Lab Results  Component Value Date   CHOL 97 03/29/2023   TRIG 75 03/29/2023   HDL 42 03/29/2023   CHOLHDL 2.3 03/29/2023   VLDL 15 03/29/2023   LDLCALC 40 03/29/2023    Physical Findings: AIMS:  , ,  ,  ,    CIWA:    COWS:     Musculoskeletal: Strength & Muscle Tone: within normal limits Gait & Station: normal Patient leans: N/A  Psychiatric Specialty Exam:  Presentation  General Appearance:  Appropriate for Environment; Casual  Eye Contact: Good  Speech: Clear and Coherent  Speech Volume: Normal  Handedness: Right   Mood and Affect  Mood: Anxious  Affect: Depressed   Thought Process  Thought Processes: Coherent  Descriptions of Associations:Intact  Orientation:Full (Time, Place and Person)  Thought Content:WDL (History of some paranoid thoughts recently)  History of Schizophrenia/Schizoaffective disorder:No  Duration of Psychotic Symptoms:No data recorded Hallucinations:No data recorded Ideas of Reference:Paranoia  Suicidal Thoughts:No data recorded Homicidal Thoughts:No data recorded  Sensorium  Memory: Immediate Good  Judgment: Fair  Insight: Fair   Art therapist  Concentration: Fair  Attention Span: Fair  Recall: [employer] of Knowledge:No data  recorded Language: Fair   Psychomotor Activity  Psychomotor Activity:No data recorded  Assets  Assets: Manufacturing systems engineer; Desire for Improvement; Financial Resources/Insurance; Housing; Social Support; Resilience; Physical Health   Sleep  Sleep:No data recorded   Physical Exam: Physical Exam Vitals and nursing note reviewed.  Constitutional:      Appearance: Normal appearance. She is normal weight.  Neurological:     General: No focal deficit present.     Mental Status: She is alert and oriented to person, place, and time.  Psychiatric:        Attention and Perception: Attention and perception normal.        Mood and Affect: Mood and affect normal.        Speech: Speech normal.        Behavior: Behavior normal. Behavior is cooperative.        Thought Content: Thought content normal.        Cognition and Memory: Cognition and memory normal.        Judgment: Judgment normal.    Review of Systems  Constitutional: Negative.  HENT: Negative.    Eyes: Negative.   Respiratory: Negative.    Cardiovascular: Negative.   Gastrointestinal: Negative.   Genitourinary: Negative.   Musculoskeletal: Negative.   Skin: Negative.   Neurological: Negative.   Endo/Heme/Allergies: Negative.   Psychiatric/Behavioral: Negative.     Blood pressure (!) 111/56, pulse 89, temperature 97.9 F (36.6 C), resp. rate 14, height 5\' 3"  (1.6 m), weight 61.2 kg, SpO2 100 %. Body mass index is 23.91 kg/m.   Treatment Plan Summary: Daily contact with patient to assess and evaluate symptoms and progress in treatment, Medication management, and Plan continue current medications.  Discharge tomorrow.  Rawan Riendeau Tresea Mall, DO 03/30/2023, 11:45 AM

## 2023-03-30 NOTE — Group Note (Signed)
[facility] LCSW Group Therapy Note   Group Date: 03/30/2023 Start Time: 1300 End Time: 1400   Type of Therapy/Topic:  Group Therapy:  Balance in Life  Participation Level:  Active   Description of Group:    This group will address the concept of balance and how it feels and looks when one is unbalanced. Patients will be encouraged to process areas in their lives that are out of balance, and identify reasons for remaining unbalanced. Facilitators will guide patients utilizing problem- solving interventions to address and correct the stressor making their life unbalanced. Understanding and applying boundaries will be explored and addressed for obtaining  and maintaining a balanced life. Patients will be encouraged to explore ways to assertively make their unbalanced needs known to significant others in their lives, using other group members and facilitator for support and feedback.  Therapeutic Goals: Patient will identify two or more emotions or situations they have that consume much of in their lives. Patient will identify signs/triggers that life has become out of balance:  Patient will identify two ways to set boundaries in order to achieve balance in their lives:  Patient will demonstrate ability to communicate their needs through discussion and/or role plays  Summary of Patient Progress: Patient was present in group and actively engaged.  Patient was supportive of other group members and appropriate.  Patient was able to take the skills discussed and relate them to her life.  Patient displayed clear thinking and appeared to be insightful.    Therapeutic Modalities:   Cognitive Behavioral Therapy Solution-Focused Therapy Assertiveness Training   Harden Mo, LCSW

## 2023-03-31 MED ORDER — FLUOXETINE HCL 20 MG PO CAPS: 20.0000 mg | ORAL_CAPSULE | Freq: Every morning | ORAL | 3 refills | Status: AC

## 2023-03-31 MED ORDER — LISINOPRIL 40 MG PO TABS: 40.0000 mg | ORAL_TABLET | Freq: Every day | ORAL | 3 refills | Status: AC

## 2023-03-31 MED ORDER — RISPERIDONE 1 MG PO TABS: 1.0000 mg | ORAL_TABLET | Freq: Every day | ORAL | 3 refills | Status: AC

## 2023-03-31 MED ORDER — RISPERIDONE 0.5 MG PO TABS: 0.5000 mg | ORAL_TABLET | ORAL | 3 refills | Status: AC

## 2023-03-31 NOTE — Discharge Summary (Signed)
Physician Discharge Summary Note  Patient:  Miranda Delgado is an 72 y.o., female MRN:  [id number] DOB:  [birth year] Patient phone:  [phone] (home)  Patient address:   [address] [place] [address] [address],  Total Time spent with patient: 1 hour  Date of Admission:  03/23/2023 Date of Discharge: 03/31/2023  Reason for Admission:   Edit is a very pleasant 72 year old white female who was voluntarily admitted to inpatient psychiatry for worsening depression and anxiety.  She is a patient of Dr. Maryruth Bun and informs me that she stopped her Prozac about 2 months ago because she was feeling better.  She has 1 previous psychiatric admission in [place] about 2 years ago when she was having auditory hallucinations and was placed on Risperdal which she says was helpful.  She denies any auditory hallucinations or suicidal ideation at this time.  Her Prozac and Risperdal have been restarted.  I spoke to her about trazodone if she needs something for sleep.   Principal Problem: MDD (major depressive disorder) Discharge Diagnoses: Principal Problem:   MDD (major depressive disorder)   Past Psychiatric History: She is currently being followed by Dr. Maryruth Bun.  She tells me that she has 1 previous psychiatric admission 2 years ago in [place] for auditory hallucinations and depression.  She has a history of depression and anxiety.  No past suicide attempts.  She has been on Zyprexa, Lexapro and Ativan in the past.   Past Medical History:  Past Medical History:  Diagnosis Date   Diabetes mellitus without complication (HCC)    Hypertension     Past Surgical History:  Procedure Laterality Date   CESAREAN SECTION     COLONOSCOPY WITH PROPOFOL N/A 09/19/2022   Procedure: COLONOSCOPY WITH PROPOFOL;  Surgeon: Jaynie Collins, DO;  Location: [facility] ENDOSCOPY;  Service: Gastroenterology;  Laterality: N/A;   TONSILLECTOMY     Family History: History reviewed. No pertinent family  history. Family Psychiatric  History: Unremarkable Social History:  Social History   Substance and Sexual Activity  Alcohol Use Not Currently     Social History   Substance and Sexual Activity  Drug Use Not Currently    Social History   Socioeconomic History   Marital status: Married    Spouse name: Not on file   Number of children: Not on file   Years of education: Not on file   Highest education level: Not on file  Occupational History   Not on file  Tobacco Use   Smoking status: Never   Smokeless tobacco: Never  Vaping Use   Vaping Use: Never used  Substance and Sexual Activity   Alcohol use: Not Currently   Drug use: Not Currently   Sexual activity: Not Currently  Other Topics Concern   Not on file  Social History Narrative   Not on file   Social Determinants of Health   Financial Resource Strain: Not on file  Food Insecurity: No Food Insecurity (03/23/2023)   Hunger Vital Sign    Worried About Running Out of Food in the Last Year: Never true    Ran Out of Food in the Last Year: Never true  Transportation Needs: No Transportation Needs (03/23/2023)   PRAPARE - Administrator, Civil Service (Medical): No    Lack of Transportation (Non-Medical): No  Physical Activity: Not on file  Stress: Not on file  Social Connections: Not on file    Hospital Course: Lyvia is a 72 year old white female who was  voluntarily admitted to geriatric psychiatry for worsening depression and suicidal ideation.  She had stopped her medications over the past year because she was feeling better.  She was afraid that her psychiatrist was going to be angry with her and I reassured her that that would not be the case.  She said her medicines were working for her so I wrote for her back on the same medications which include Prozac which stayed at 20 mg and Resporal 1 mg at bedtime, initially.  She continued to complain of some anxiety so I increased the Risperdal to 0.5 twice a  day and 1 at bedtime and that seemed to help.  She will remain on her blood pressure medicine but her Zestril was increased to 40 mg/day.  She might need some tweaking of her blood pressure medicine upon discharge.  Her CBC was within normal limits.  Lipid profile was within normal limits.  CBC was within normal limits.  Hemoglobin A1c was elevated at 7.1.  TSH was within normal limits.  She was compliant with medications and interacted with staff and peers appropriately.  It was felt that she maximized hospitalization she was discharged home.  On the day of discharge she denied suicidal ideation, homicidal ideation, auditory or visual hallucinations.  Her judgment and insight were good.  Physical Findings: AIMS:  , ,  ,  ,    CIWA:    COWS:     Musculoskeletal: Strength & Muscle Tone: within normal limits Gait & Station: normal Patient leans: N/A   Psychiatric Specialty Exam:  Presentation  General Appearance:  Appropriate for Environment; Casual  Eye Contact: Good  Speech: Clear and Coherent  Speech Volume: Normal  Handedness: Right   Mood and Affect  Mood: Anxious  Affect: Depressed   Thought Process  Thought Processes: Coherent  Descriptions of Associations:Intact  Orientation:Full (Time, Place and Person)  Thought Content:WDL (History of some paranoid thoughts recently)  History of Schizophrenia/Schizoaffective disorder:No  Duration of Psychotic Symptoms:No data recorded Hallucinations:No data recorded Ideas of Reference:Paranoia  Suicidal Thoughts:No data recorded Homicidal Thoughts:No data recorded  Sensorium  Memory: Immediate Good  Judgment: Fair  Insight: Fair   Art therapist  Concentration: Fair  Attention Span: Fair  Recall: [employer] of Knowledge:No data recorded Language: Fair   Psychomotor Activity  Psychomotor Activity:No data recorded  Assets  Assets: Manufacturing systems engineer; Desire for Improvement;  Financial Resources/Insurance; Housing; Social Support; Resilience; Physical Health   Sleep  Sleep:No data recorded   Physical Exam: Physical Exam Vitals and nursing note reviewed.  Constitutional:      Appearance: Normal appearance. She is normal weight.  Neurological:     General: No focal deficit present.     Mental Status: She is alert and oriented to person, place, and time.  Psychiatric:        Attention and Perception: Attention and perception normal.        Mood and Affect: Mood and affect normal.        Speech: Speech normal.        Behavior: Behavior normal. Behavior is cooperative.        Thought Content: Thought content normal.        Cognition and Memory: Cognition and memory normal.        Judgment: Judgment normal.    Review of Systems  Constitutional: Negative.   HENT: Negative.    Eyes: Negative.   Respiratory: Negative.    Cardiovascular: Negative.   Gastrointestinal: Negative.  Genitourinary: Negative.   Musculoskeletal: Negative.   Skin: Negative.   Neurological: Negative.   Endo/Heme/Allergies: Negative.   Psychiatric/Behavioral: Negative.     Blood pressure 122/62, pulse 90, temperature (!) 97.3 F (36.3 C), resp. rate 15, height 5\' 3"  (1.6 m), weight 61.2 kg, SpO2 100 %. Body mass index is 23.91 kg/m.   Social History   Tobacco Use  Smoking Status Never  Smokeless Tobacco Never   Tobacco Cessation:  N/A, patient does not currently use tobacco products   Blood Alcohol level:  Lab Results  Component Value Date   ETH <5 07/15/2017    Metabolic Disorder Labs:  Lab Results  Component Value Date   HGBA1C 7.1 (H) 03/29/2023   MPG 157 03/29/2023   No results found for: "PROLACTIN" Lab Results  Component Value Date   CHOL 97 03/29/2023   TRIG 75 03/29/2023   HDL 42 03/29/2023   CHOLHDL 2.3 03/29/2023   VLDL 15 03/29/2023   LDLCALC 40 03/29/2023    See Psychiatric Specialty Exam and Suicide Risk Assessment completed by  Attending Physician prior to discharge.  Discharge destination:  Home  Is patient on multiple antipsychotic therapies at discharge:  No   Has Patient had three or more failed trials of antipsychotic monotherapy by history:  No  Recommended Plan for Multiple Antipsychotic Therapies: NA   Allergies as of 03/31/2023   No Known Allergies      Medication List     STOP taking these medications    HumaLOG KwikPen 100 UNIT/ML KwikPen Generic drug: insulin lispro   Levemir FlexTouch 100 UNIT/ML FlexPen Generic drug: insulin detemir       TAKE these medications      Indication  amLODipine 10 MG tablet Commonly known as: NORVASC Take 10 mg by mouth daily.    aspirin EC 81 MG tablet Take 81 mg by mouth daily.    cyanocobalamin 1000 MCG tablet Commonly known as: VITAMIN B12 Take 1,000 mcg by mouth daily.    Ferrex 150 150 MG capsule Generic drug: iron polysaccharides Take 150 mg by mouth daily.    FLUoxetine 20 MG capsule Commonly known as: PROZAC Take 1 capsule (20 mg total) by mouth every morning. Start taking on: April 01, 2023 What changed: when to take this  Indication: Depression   lisinopril 40 MG tablet Commonly known as: ZESTRIL Take 1 tablet (40 mg total) by mouth daily. Start taking on: April 01, 2023 What changed:  medication strength how much to take  Indication: High Blood Pressure Disorder   magnesium oxide 400 (240 Mg) MG tablet Commonly known as: MAG-OX Take 400 mg by mouth daily.    metFORMIN 500 MG tablet Commonly known as: GLUCOPHAGE Take 500 mg by mouth 2 (two) times daily.    metoprolol tartrate 25 MG tablet Commonly known as: LOPRESSOR Take 1 tablet (25 mg total) by mouth 2 (two) times daily for 15 days.    omeprazole 20 MG capsule Commonly known as: PRILOSEC Take 20 mg by mouth daily.    risperiDONE 1 MG tablet Commonly known as: RISPERDAL Take 1 tablet (1 mg total) by mouth at bedtime.  Indication: Major Depressive Disorder    risperiDONE 0.5 MG tablet Commonly known as: RISPERDAL Take 1 tablet (0.5 mg total) by mouth 2 (two) times daily at 8 am and 4 pm.  Indication: Major Depressive Disorder   SEMAGLUTIDE(0.25 OR 0.5MG /DOS) [place] Inject into the skin.    simvastatin 20 MG tablet Commonly known as: ZOCOR Take  20 mg by mouth daily.         Follow-up Information     [facility] Follow up.   Why: Appointment is scheduled for 04/19/2023 at 4:30PM. Contact information: [address] [place], [place] [address] Phone: [phone] Fax: [phone]                Follow-up recommendations:  Dr. Maryruth Bun  Comments:    Signed: Sarina Ill, DO 03/31/2023, 10:23 AM

## 2023-03-31 NOTE — Progress Notes (Signed)
Patient resting quietly in bed with eyes closed, Respirations equal and unlabored, skin warm and dry, NAD. Routine safety checks conducted according to facility protocol. Will continue to monitor for safety. 

## 2023-03-31 NOTE — [facility]HH Suicide Risk Assessment (Signed)
[facility] Discharge Suicide Risk Assessment   Principal Problem: MDD (major depressive disorder) Discharge Diagnoses: Principal Problem:   MDD (major depressive disorder)   Total Time spent with patient: 1 hour  Musculoskeletal: Strength & Muscle Tone: within normal limits Gait & Station: normal Patient leans: N/A  Psychiatric Specialty Exam  Presentation  General Appearance:  Appropriate for Environment; Casual  Eye Contact: Good  Speech: Clear and Coherent  Speech Volume: Normal  Handedness: Right   Mood and Affect  Mood: Anxious  Duration of Depression Symptoms: Greater than two weeks  Affect: Depressed   Thought Process  Thought Processes: Coherent  Descriptions of Associations:Intact  Orientation:Full (Time, Place and Person)  Thought Content:WDL (History of some paranoid thoughts recently)  History of Schizophrenia/Schizoaffective disorder:No  Duration of Psychotic Symptoms:No data recorded Hallucinations:No data recorded Ideas of Reference:Paranoia  Suicidal Thoughts:No data recorded Homicidal Thoughts:No data recorded  Sensorium  Memory: Immediate Good  Judgment: Fair  Insight: Fair   Art therapist  Concentration: Fair  Attention Span: Fair  Recall: [employer] of Knowledge:No data recorded Language: Fair   Psychomotor Activity  Psychomotor Activity:No data recorded  Assets  Assets: Manufacturing systems engineer; Desire for Improvement; Financial Resources/Insurance; Housing; Social Support; Resilience; Physical Health   Sleep  Sleep:No data recorded   Blood pressure 122/62, pulse 90, temperature (!) 97.3 F (36.3 C), resp. rate 15, height 5\' 3"  (1.6 m), weight 61.2 kg, SpO2 100 %. Body mass index is 23.91 kg/m.  Mental Status Per Nursing Assessment::   On Admission:  NA  Demographic Factors:  Age 37 or older and Caucasian  Loss Factors: NA  Historical Factors: NA  Risk Reduction Factors:   Sense of  responsibility to family, Positive social support, Positive therapeutic relationship, and Positive coping skills or problem solving skills  Continued Clinical Symptoms:  Depression:   Anhedonia  Cognitive Features That Contribute To Risk:  None    Suicide Risk:  Minimal: No identifiable suicidal ideation.  Patients presenting with no risk factors but with morbid ruminations; may be classified as minimal risk based on the severity of the depressive symptoms   Follow-up Information     [facility] Follow up.   Why: Appointment is scheduled for 04/19/2023 at 4:30PM. Contact information: [address] [place], [place] [address] Phone: [phone] Fax: [phone]                Plan Of Care/Follow-up recommendations: Dr. Archie Patten, DO 03/31/2023, 10:05 AM

## 2023-03-31 NOTE — Care Management Important Message (Signed)
Important Message  Patient Details  Name: Miranda Delgado MRN: [id number] Date of Birth: 12/19/1950   Medicare Important Message Given:  Yes     Harden Mo, LCSW 03/31/2023, 11:05 AM

## 2023-03-31 NOTE — Progress Notes (Signed)
   03/31/23 0714  Psych Admission Type (Psych Patients Only)  Admission Status Voluntary  Psychosocial Assessment  Patient Complaints None  Eye Contact Fair  Facial Expression Fixed smile;Animated  Affect Appropriate to circumstance  Speech Logical/coherent  Interaction Assertive  Motor Activity Slow  Appearance/Hygiene Unremarkable  Behavior Characteristics Cooperative;Calm  Mood Pleasant  Thought Process  Coherency WDL  Content WDL  Delusions None reported or observed  Perception WDL  Hallucination None reported or observed  Judgment WDL  Confusion None  Danger to Self  Current suicidal ideation? Denies  Danger to Others  Danger to Others None reported or observed

## 2023-03-31 NOTE — Progress Notes (Signed)
  [facility] Adult Case Management Discharge Plan :  Will you be returning to the same living situation after discharge:  Yes,  pt reports that she is returning home. At discharge, do you have transportation home?: Yes,  pt reports that her husband will provide transportation. Do you have the ability to pay for your medications: Yes,  Medicare Part A and B  Release of information consent forms completed and in the chart;  Patient's signature needed at discharge.  Patient to Follow up at:  Follow-up Information     [facility] Follow up.   Why: Appointment is scheduled for 04/19/2023 at 4:30PM. Contact information: [address] [place], [place] [address] Phone: [phone] Fax: [phone]                Next level of care provider has access to [facility] Link:no  Safety Planning and Suicide Prevention discussed: Yes,  SPE completed with the patients husband.     Has patient been referred to the Quitline?: Patient does not use tobacco/nicotine products  Patient has been referred for addiction treatment: No known substance use disorder.  Harden Mo, LCSW 03/31/2023, 11:00 AM

## 2023-03-31 NOTE — Progress Notes (Signed)
Patient ID: Miranda Delgado, female   DOB: 09-04-1951, 72 y.o.   MRN: [id number]  Patient was discharged from [facility] unit at approx 1345 escorted by staff. Patient denies SI/HI/AVH. Discharge packet to include printed AVS, Suicide Risk Assessment, and Transition Record reviewed with patient. Belongings of eyeglasses, clothes, and a Bible returned. Suicide safety plan completed with a copy kept in chart.

## 2023-03-31 NOTE — [facility] Group Notes (Signed)
[facility] Group Notes:  (Nursing/MHT/Case Management/Adjunct)  Date:  03/31/2023  Time:  10:19 AM  Type of Therapy:  Movement Therapy  Participation Level:  Active  Participation Quality:  Appropriate  Affect:  Appropriate  Cognitive:  Appropriate  Insight:  Appropriate  Engagement in Group:  Engaged  Modes of Intervention:  Activity  Summary of Progress/Problems:  Miranda Delgado 03/31/2023, 10:19 AM

## 2023-06-14 ENCOUNTER — Emergency Department: Payer: Medicare Other

## 2023-06-14 ENCOUNTER — Emergency Department
Admission: EM | Admit: 2023-06-14 | Discharge: 2023-06-14 | Disposition: A | Discharge status: Home / Self Care | Payer: Medicare Other | Attending: Emergency Medicine | Admitting: Emergency Medicine

## 2023-06-14 DIAGNOSIS — R531 Weakness: Secondary | ICD-10-CM | POA: Diagnosis present

## 2023-06-14 DIAGNOSIS — E119 Type 2 diabetes mellitus without complications: Secondary | ICD-10-CM | POA: Insufficient documentation

## 2023-06-14 DIAGNOSIS — Z1152 Encounter for screening for COVID-19: Secondary | ICD-10-CM | POA: Insufficient documentation

## 2023-06-14 DIAGNOSIS — I10 Essential (primary) hypertension: Secondary | ICD-10-CM | POA: Diagnosis not present

## 2023-06-14 LAB — COMPREHENSIVE METABOLIC PANEL
ALT: 12 U/L (ref 0–44)
AST: 21 U/L (ref 15–41)
Albumin: 3.9 g/dL (ref 3.5–5.0)
Alkaline Phosphatase: 44 U/L (ref 38–126)
Anion gap: 9 (ref 5–15)
BUN: 17 mg/dL (ref 8–23)
CO2: 19 mmol/L — ABNORMAL LOW (ref 22–32)
Calcium: 8.9 mg/dL (ref 8.9–10.3)
Chloride: 107 mmol/L (ref 98–111)
Creatinine, Ser: 1.01 mg/dL — ABNORMAL HIGH (ref 0.44–1.00)
GFR, Estimated: 59 mL/min — ABNORMAL LOW (ref >60)
Glucose, Bld: 171 mg/dL — ABNORMAL HIGH (ref 70–99)
Potassium: 4.1 mmol/L (ref 3.5–5.1)
Sodium: 135 mmol/L (ref 135–145)
Total Bilirubin: 0.5 mg/dL (ref 0.3–1.2)
Total Protein: 6.8 g/dL (ref 6.5–8.1)

## 2023-06-14 LAB — CBC
HCT: 32.3 % — ABNORMAL LOW (ref 36.0–46.0)
Hemoglobin: 10.6 g/dL — ABNORMAL LOW (ref 12.0–15.0)
MCH: 29.1 pg (ref 26.0–34.0)
MCHC: 32.8 g/dL (ref 30.0–36.0)
MCV: 88.7 fL (ref 80.0–100.0)
Platelets: 219 10*3/uL (ref 150–400)
RBC: 3.64 MIL/uL — ABNORMAL LOW (ref 3.87–5.11)
RDW: 12.8 % (ref 11.5–15.5)
WBC: 15.5 10*3/uL — ABNORMAL HIGH (ref 4.0–10.5)
nRBC: 0 % (ref 0.0–0.2)

## 2023-06-14 LAB — TROPONIN I (HIGH SENSITIVITY): Troponin I (High Sensitivity): 6 ng/L (ref <18)

## 2023-06-14 LAB — SARS CORONAVIRUS 2 BY RT PCR: SARS Coronavirus 2 by RT PCR: NEGATIVE

## 2023-06-14 MED ORDER — LACTATED RINGERS IV BOLUS
1000.0000 mL | Freq: Once | INTRAVENOUS | Status: AC
  Administered 2023-06-14: 1000 mL via INTRAVENOUS

## 2023-06-14 MED ORDER — ONDANSETRON HCL 4 MG/2ML IJ SOLN
4.0000 mg | Freq: Once | INTRAMUSCULAR | Status: AC
  Administered 2023-06-14: 4 mg via INTRAVENOUS
  Filled 2023-06-14: qty 2

## 2023-06-14 NOTE — ED Triage Notes (Signed)
Pt from home was attempting to have  BM states she has been constipated (last BM 3 days ago) and got dizzy, Pt went to lay down in bed. EMS reports pt was positive for orthostatics. Pt given 300 mL fluids.

## 2023-06-14 NOTE — ED Provider Notes (Signed)
Patient's lab work reviewed, COVID test is negative, CT head is reassuring.  Reassessed patient, she is feeling much better.  She reports she still feeling constipated.  She feels that she may be impacted, disimpaction performed, patient with soft stool in the rectal vault, aborted procedure, she will be able to treat this with MiraLAX as needed.  Appropriate for discharge at this time   Jene Every, MD 06/14/23 1757

## 2023-06-14 NOTE — ED Provider Notes (Signed)
[facility] Provider Note    Event Date/Time   First MD Initiated Contact with Patient 06/14/23 1423     (approximate)  History   Chief Complaint: Dizziness  HPI  Miranda Delgado is a 72 y.o. female with a past medical history of diabetes, hypertension, presents to the emergency department for generalized weakness/fatigue.  According to the patient she states for the last 3 days she has been constipated.  Attempted to have a bowel movement earlier today and states states she was straining.  She states shortly after the bowel movement she was just feeling weak.  Denies any specific symptoms.  Denies any vomiting, no diarrhea.  Denies any fever cough congestion.  No abdominal pain.  Physical Exam   Triage Vital Signs: ED Triage Vitals  Encounter Vitals Group     BP 06/14/23 1421 129/60     Systolic BP Percentile --      Diastolic BP Percentile --      Pulse Rate 06/14/23 1421 88     Resp 06/14/23 1421 16     Temp 06/14/23 1421 (!) 97.5 F (36.4 C)     Temp Source 06/14/23 1421 Oral     SpO2 06/14/23 1421 96 %     Weight 06/14/23 1422 140 lb (63.5 kg)     Height 06/14/23 1422 5\' 3"  (1.6 m)     Head Circumference --      Peak Flow --      Pain Score 06/14/23 1421 0     Pain Loc --      Pain Education --      Exclude from Growth Chart --     Most recent vital signs: Vitals:   06/14/23 1421  BP: 129/60  Pulse: 88  Resp: 16  Temp: (!) 97.5 F (36.4 C)  SpO2: 96%    General: Awake, no distress.  CV:  Good peripheral perfusion.  Regular rate and rhythm  Resp:  Normal effort.  Equal breath sounds bilaterally.  Abd:  No distention.  Soft, nontender.  No rebound or guarding.  ED Results / Procedures / Treatments   EKG  EKG viewed and interpreted by myself shows a normal sinus rhythm at 91 bpm with a narrow QRS, normal axis, normal intervals, no concerning ST changes.  MEDICATIONS ORDERED IN ED: Medications  lactated ringers bolus 1,000 mL  (has no administration in time range)  ondansetron (ZOFRAN) injection 4 mg (has no administration in time range)     IMPRESSION / MDM / ASSESSMENT AND PLAN / ED COURSE  I reviewed the triage vital signs and the nursing notes.  Patient's presentation is most consistent with acute presentation with potential threat to life or bodily function.  Patient presents to the emergency department for generalized fatigue/weakness.  Patient states she was straining to have a bowel movement and shortly afterwards began feeling very weak and fatigued.  Patient's main concern continues to be her constipation x 3 days.  Denies any abdominal pain but she states normally she has a bowel movement every day.  No abdominal tenderness on my exam.  We will check labs.  Given the patient's symptoms started after straining we will obtain a CT scan of the head as a precaution to rule out any type of bleed although the patient denies any headache.  EMS reports positive orthostatics when they were checking the patient.  We will IV hydrate while awaiting lab results.  Differential would include electrolyte or metabolic abnormality,  dehydration, intracranial abnormality.  Patient care signed out to oncoming provider.  FINAL CLINICAL IMPRESSION(S) / ED DIAGNOSES   Weakness    Note:  This document was prepared using Dragon voice recognition software and may include unintentional dictation errors.   Minna Antis, MD 06/16/23 Ebony Cargo

## 2023-07-06 IMAGING — MG MM DIGITAL SCREENING BILAT W/ TOMO AND CAD
6 of 12 series · 6 of 36 positions shown · non-contrast
Comparison: Previous exam(s).

CLINICAL DATA: Screening.

EXAM:
DIGITAL SCREENING BILATERAL MAMMOGRAM WITH TOMOSYNTHESIS AND CAD
TECHNIQUE: Bilateral screening digital craniocaudal and mediolateral oblique
mammograms were obtained. Bilateral screening digital breast
tomosynthesis was performed. The images were evaluated with
computer-aided detection.

[R MLO synth-2D]
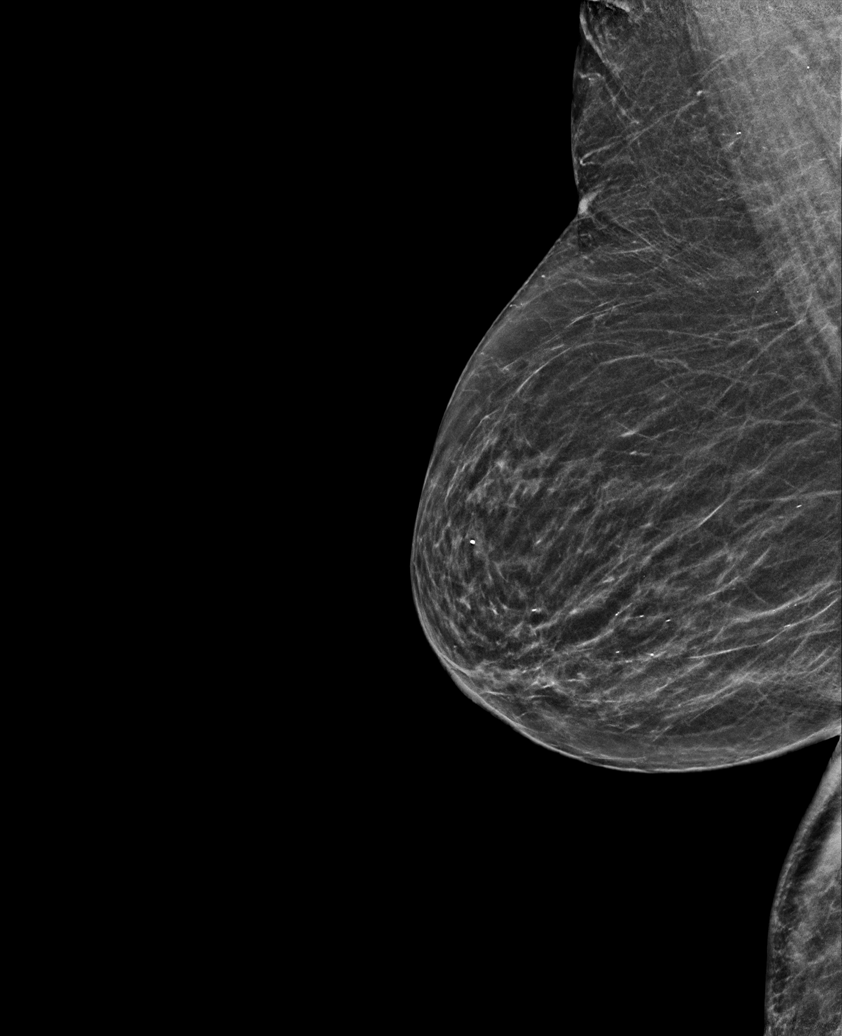

[L MLO synth-2D (1 of 2)]
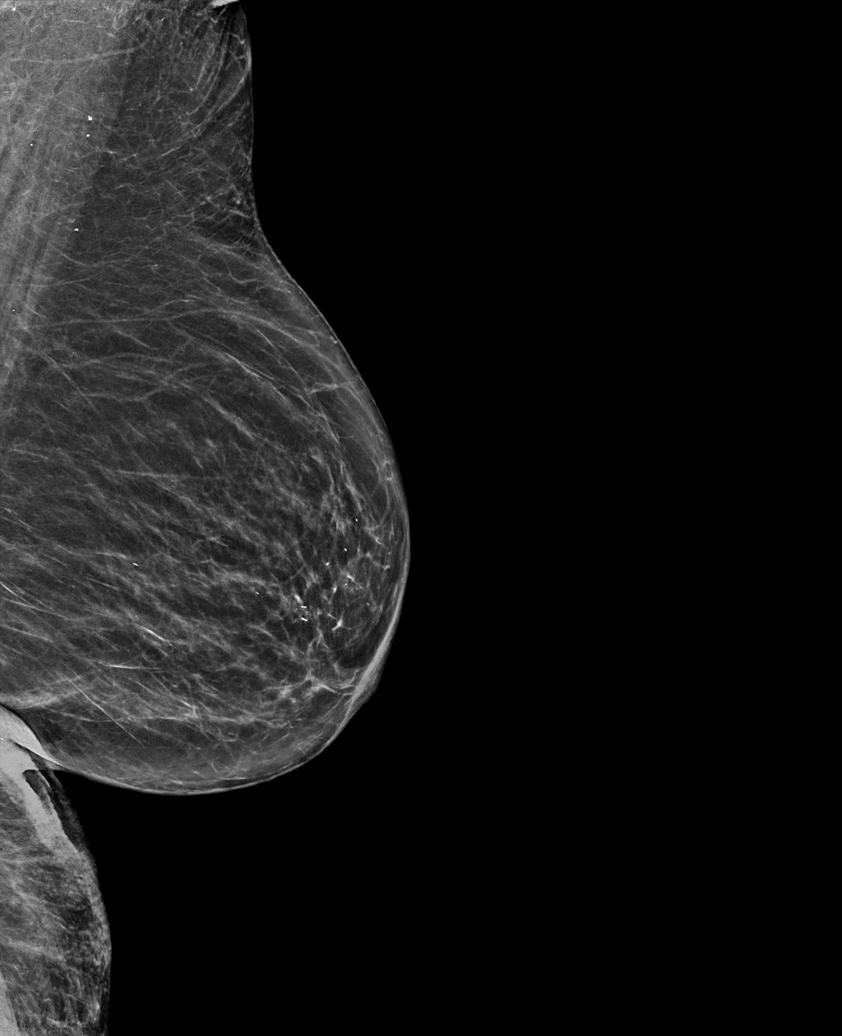

[L MLO synth-2D (2 of 2)]
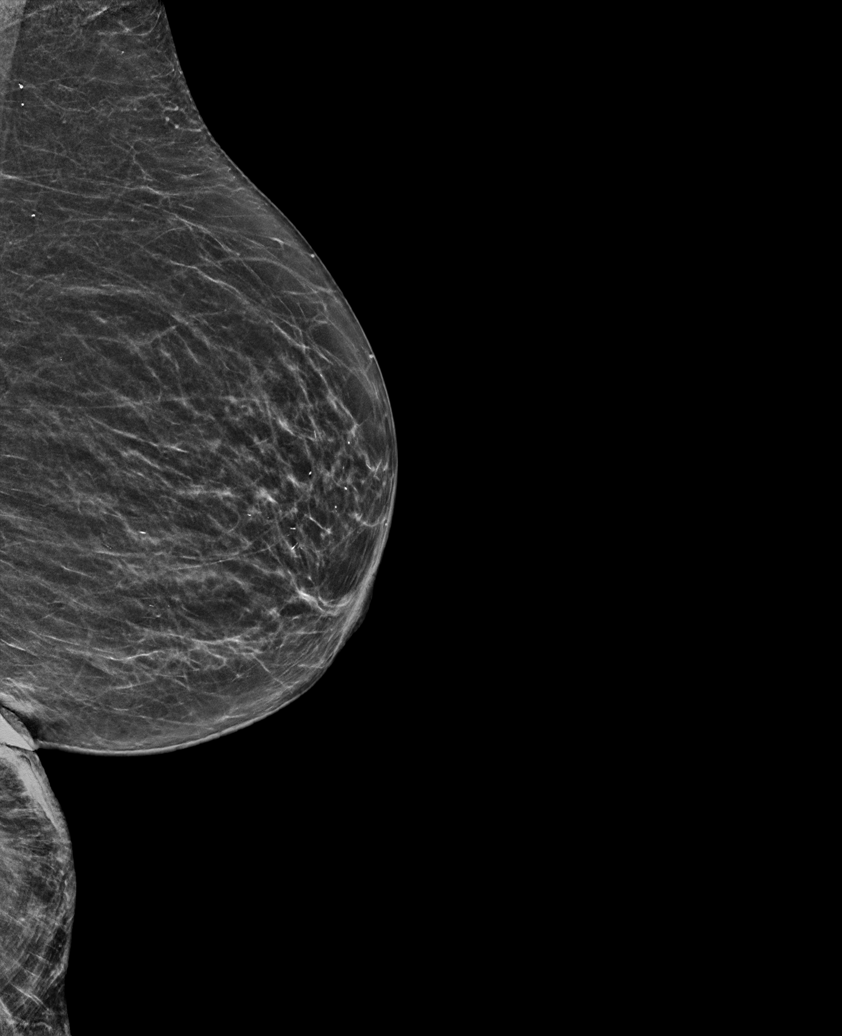

[R CC synth-2D (1 of 2)]
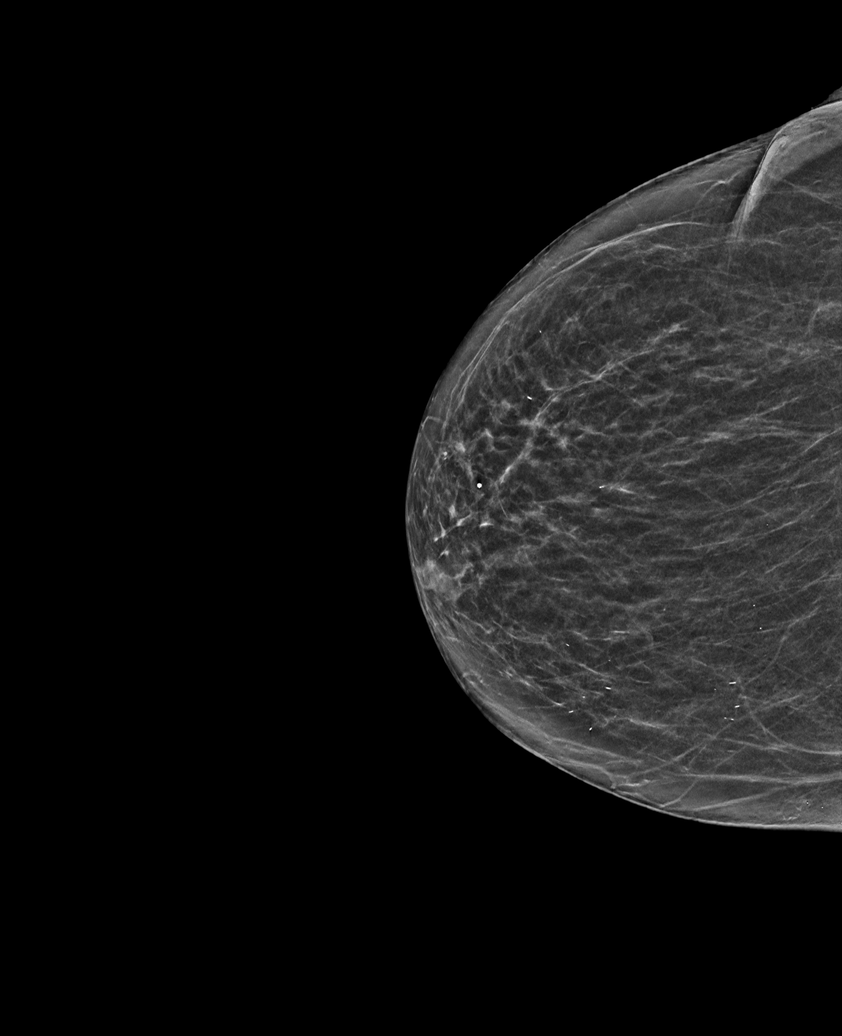

[L CC synth-2D]
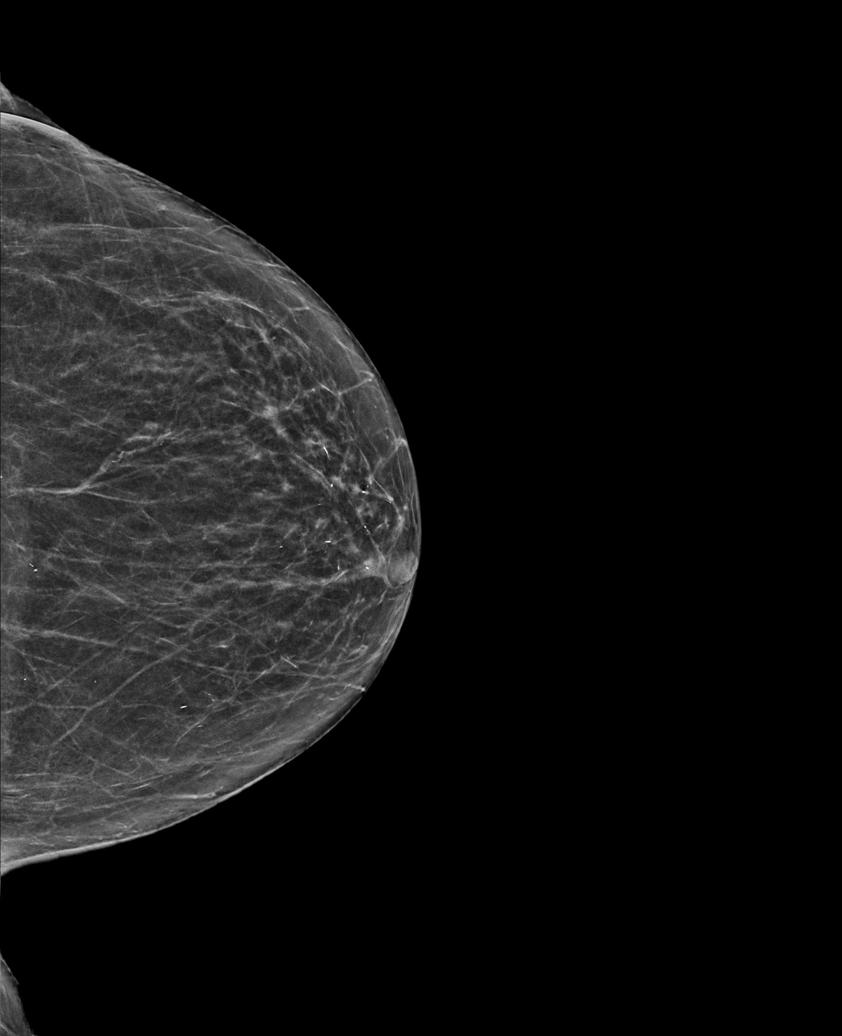

[R CC synth-2D (2 of 2)]
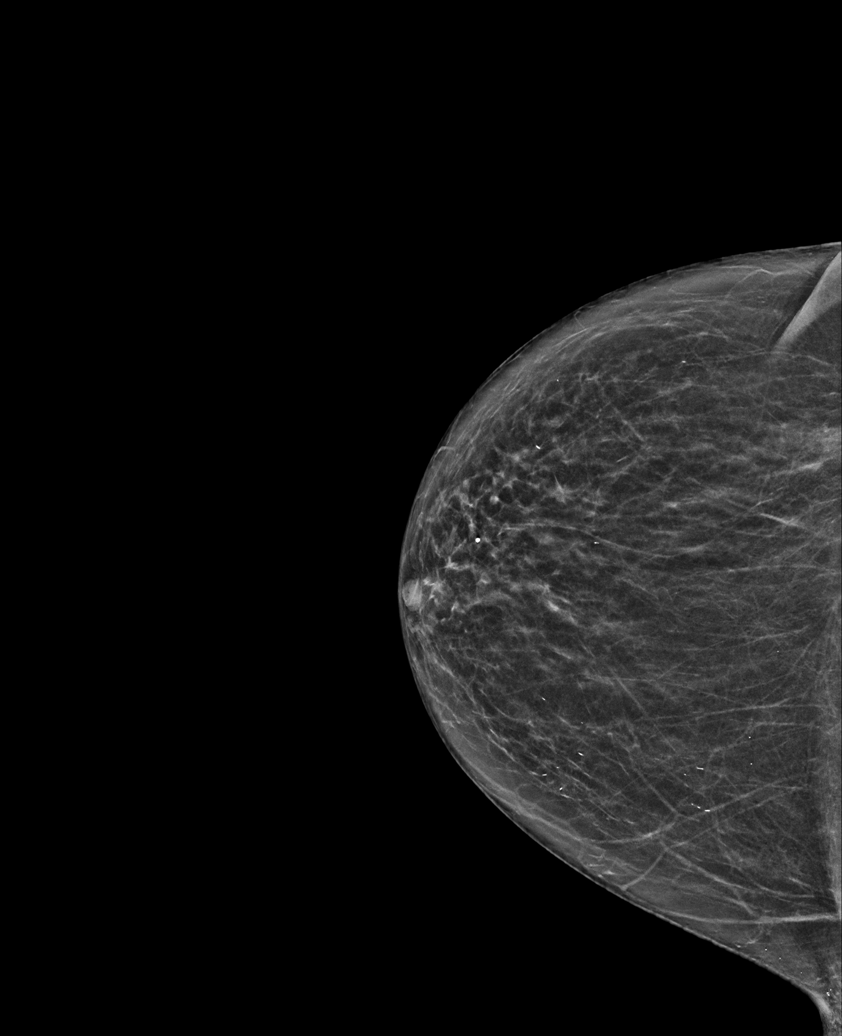

[6 of 36 positions shown; findings below may reference images not displayed]

ACR Breast Density Category b: There are scattered areas of
fibroglandular density.
FINDINGS: There are no findings suspicious for malignancy.
IMPRESSION: No mammographic evidence of malignancy. A result letter of this
screening mammogram will be mailed directly to the patient.

RECOMMENDATION:
Screening mammogram in one year. (Code:51-O-LD2)

BI-RADS CATEGORY  1: Negative.

## 2023-09-05 ENCOUNTER — Other Ambulatory Visit: Payer: Self-pay | Admitting: Internal Medicine

## 2023-09-05 DIAGNOSIS — Z1231 Encounter for screening mammogram for malignant neoplasm of breast: Secondary | ICD-10-CM

## 2023-09-11 IMAGING — CR DG CHEST 2V
2 series · 2 of 2 positions shown · non-contrast
Comparison: 10/26/2013.

CLINICAL DATA: Chest pain.

EXAM:
CHEST - 2 VIEW

[chest pa]
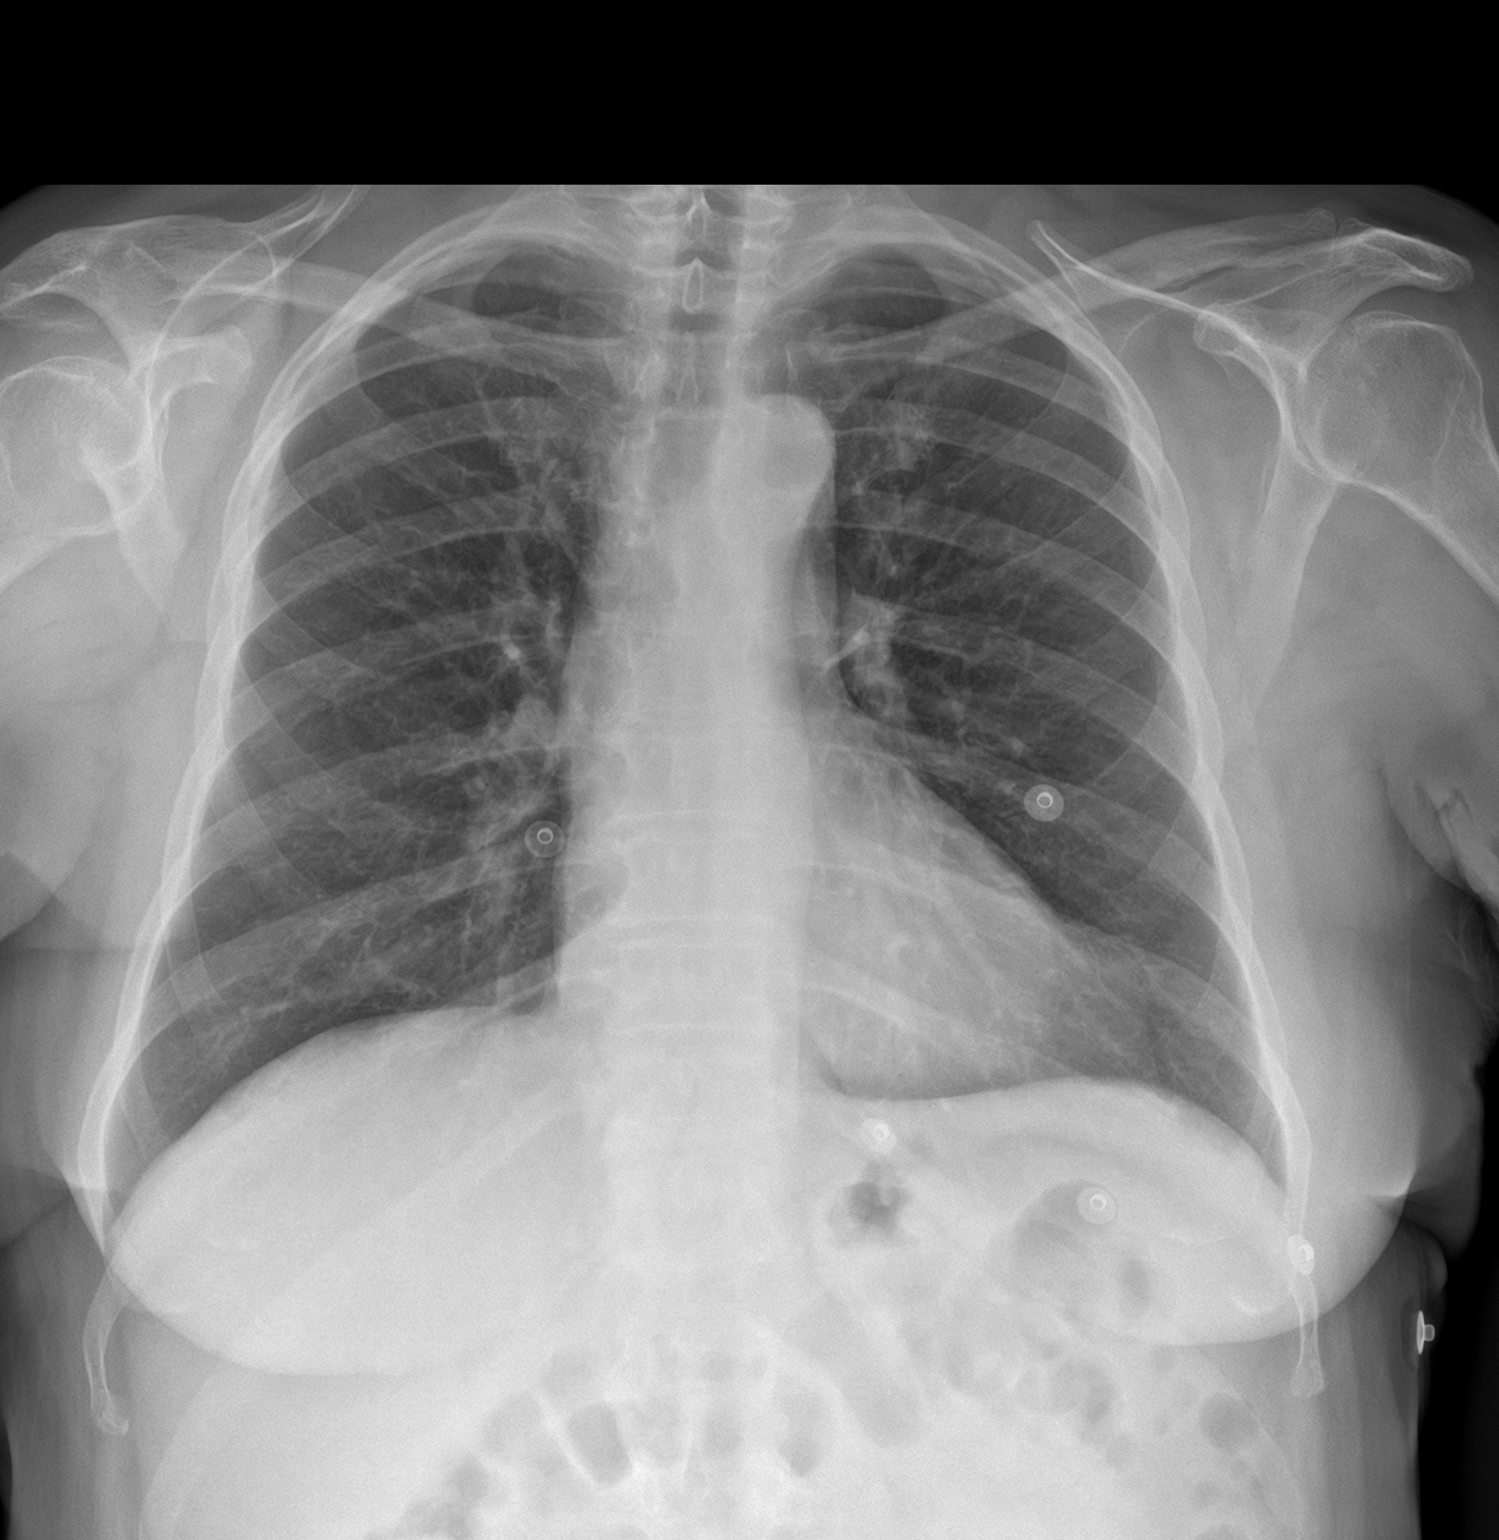

[chest lat]
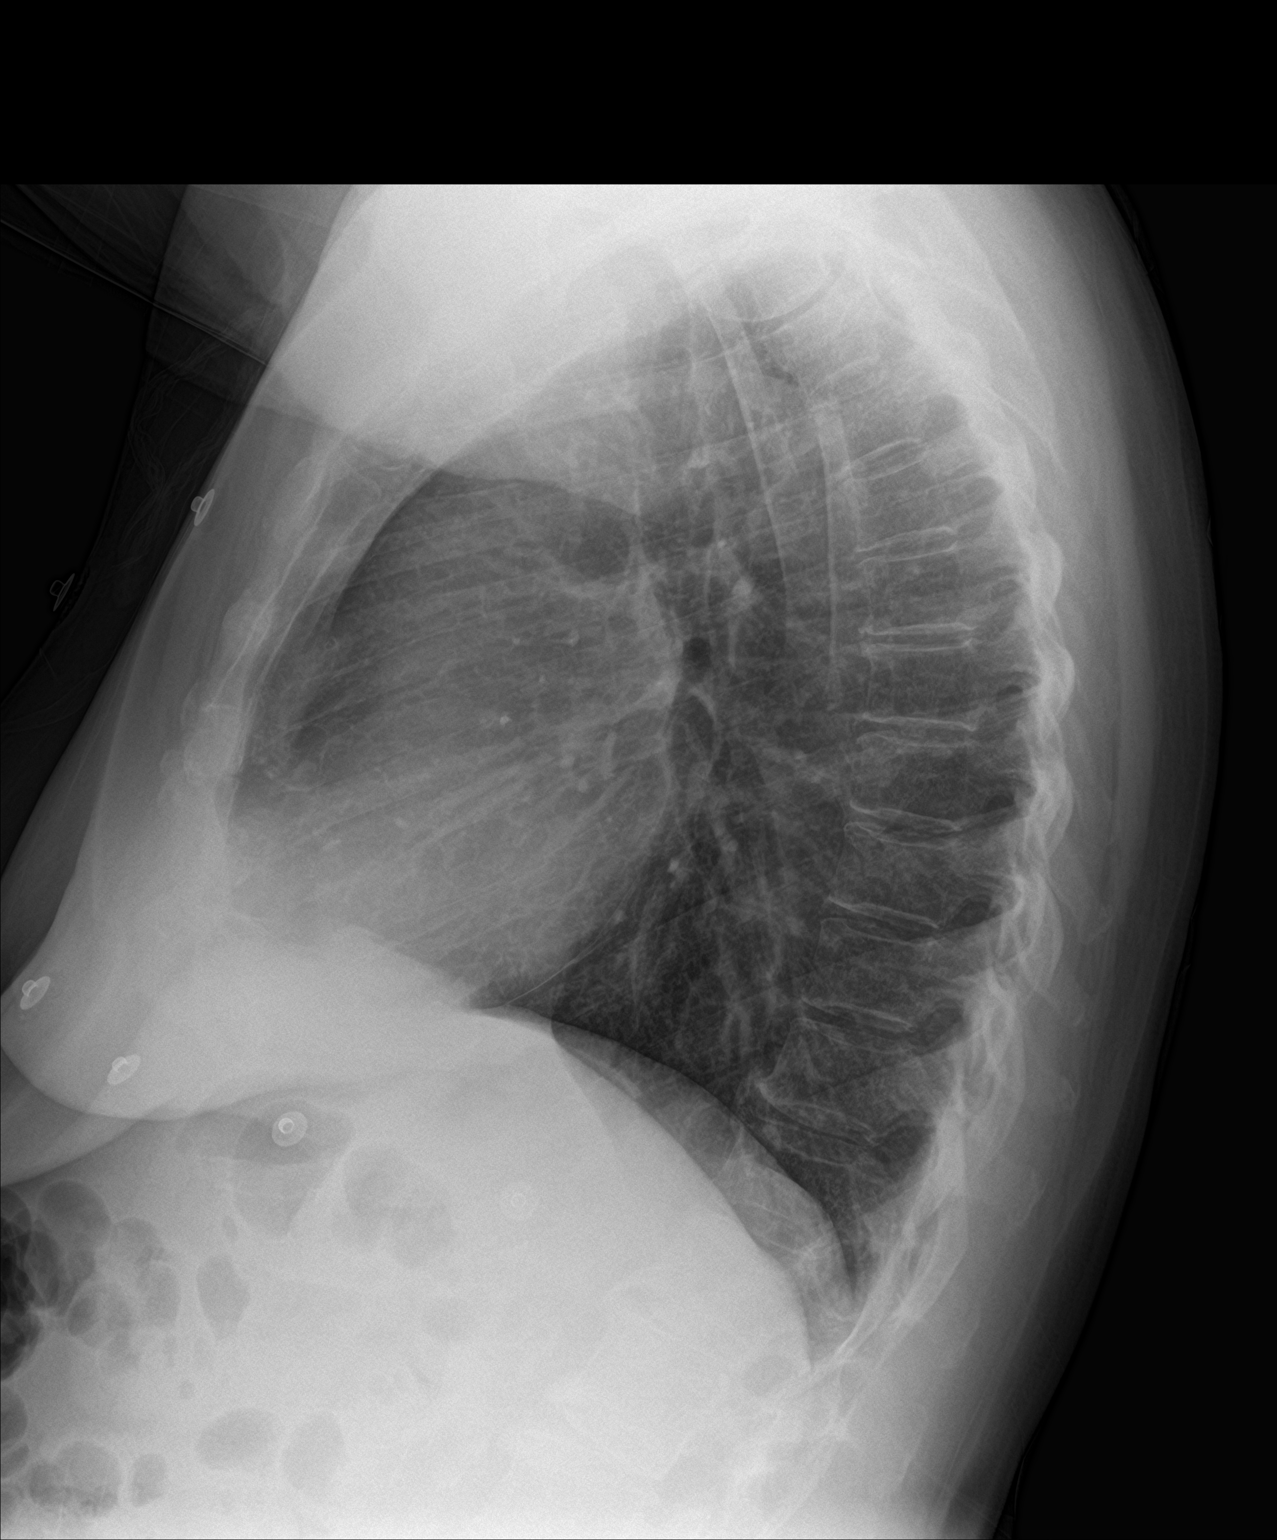

[2 of 2 positions shown; findings below may reference images not displayed]

FINDINGS: The heart size and mediastinal contours are within normal limits.
Both lungs are clear. No visible pleural effusions or pneumothorax.
No acute osseous abnormality.
IMPRESSION: No evidence of acute cardiopulmonary disease.

## 2024-02-13 ENCOUNTER — Encounter

## 2024-06-09 ENCOUNTER — Emergency Department

## 2024-06-09 ENCOUNTER — Other Ambulatory Visit: Payer: Self-pay

## 2024-06-09 DIAGNOSIS — R002 Palpitations: Secondary | ICD-10-CM | POA: Diagnosis present

## 2024-06-09 DIAGNOSIS — E119 Type 2 diabetes mellitus without complications: Secondary | ICD-10-CM | POA: Diagnosis not present

## 2024-06-09 DIAGNOSIS — I110 Hypertensive heart disease with heart failure: Secondary | ICD-10-CM | POA: Insufficient documentation

## 2024-06-09 DIAGNOSIS — I509 Heart failure, unspecified: Secondary | ICD-10-CM | POA: Diagnosis not present

## 2024-06-09 DIAGNOSIS — E860 Dehydration: Secondary | ICD-10-CM | POA: Diagnosis not present

## 2024-06-09 LAB — BASIC METABOLIC PANEL WITH GFR
Anion gap: 14 (ref 5–15)
BUN: 17 mg/dL (ref 8–23)
CO2: 17 mmol/L — ABNORMAL LOW (ref 22–32)
Calcium: 9.6 mg/dL (ref 8.9–10.3)
Chloride: 102 mmol/L (ref 98–111)
Creatinine, Ser: 1.1 mg/dL — ABNORMAL HIGH (ref 0.44–1.00)
GFR, Estimated: 53 mL/min — ABNORMAL LOW (ref >60)
Glucose, Bld: 116 mg/dL — ABNORMAL HIGH (ref 70–99)
Potassium: 3.7 mmol/L (ref 3.5–5.1)
Sodium: 133 mmol/L — ABNORMAL LOW (ref 135–145)

## 2024-06-09 LAB — CBC
HCT: 30.6 % — ABNORMAL LOW (ref 36.0–46.0)
Hemoglobin: 10.5 g/dL — ABNORMAL LOW (ref 12.0–15.0)
MCH: 29.2 pg (ref 26.0–34.0)
MCHC: 34.3 g/dL (ref 30.0–36.0)
MCV: 85 fL (ref 80.0–100.0)
Platelets: 240 K/uL (ref 150–400)
RBC: 3.6 MIL/uL — ABNORMAL LOW (ref 3.87–5.11)
RDW: 13 % (ref 11.5–15.5)
WBC: 9.7 K/uL (ref 4.0–10.5)
nRBC: 0 % (ref 0.0–0.2)

## 2024-06-09 LAB — TROPONIN I (HIGH SENSITIVITY): Troponin I (High Sensitivity): 6 ng/L (ref <18)

## 2024-06-09 NOTE — ED Triage Notes (Signed)
 Pt BIB EMS with c/o palpitations that started today. Pt denies CP or SOB. Pt describes sensation as a fluttering feeling. Pt reports anxiousness.

## 2024-06-10 ENCOUNTER — Emergency Department: Admission: EM | Admit: 2024-06-10 | Discharge: 2024-06-10 | Disposition: A | Discharge status: Home / Self Care

## 2024-06-10 DIAGNOSIS — R002 Palpitations: Secondary | ICD-10-CM | POA: Diagnosis not present

## 2024-06-10 LAB — TROPONIN I (HIGH SENSITIVITY): Troponin I (High Sensitivity): 7 ng/L (ref <18)

## 2024-06-10 NOTE — ED Provider Notes (Signed)
 [facility] Provider Note    Event Date/Time   First MD Initiated Contact with Patient 06/10/24 [phone]     (approximate)   History   Palpitations  Pt BIB EMS with c/o palpitations that started today. Pt denies CP or SOB. Pt describes sensation as a fluttering feeling. Pt reports anxiousness.     HPI Miranda Delgado is a 73 y.o. female PMH hypertension, diabetes, CHF, anxiety presents for evaluation of palpitations - Patient has had palpitations intermittently since about noon.  They last for seconds to minutes and then resolved.  Has not been having these previously.  No chest pain or shortness of breath.  Has otherwise been feeling well and in her usual state of health.  No leg swelling.  Is taking all of her medications as prescribed. - No urinary symptoms - Has not had any recurrence of these episodes since she arrived to the emergency department which was about 4 hours ago - Separately, patient's daughter notes that she has been having some intermittent episodes of dizziness over the past week or so.  Has been having some ongoing nasal congestion as well, recently started on amoxicillin which she has been taking since Tuesday.  No headache, no ear pain, no fevers.  No focal weakness.  Do not correlate with episodes of palpitations.  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  Last seen by cardiology on 06/03/2024.      Physical Exam   Triage Vital Signs: ED Triage Vitals  Encounter Vitals Group     BP 06/09/24 2228 (!) 152/60     Girls Systolic BP Percentile --      Girls Diastolic BP Percentile --      Boys Systolic BP Percentile --      Boys Diastolic BP Percentile --      Pulse Rate 06/09/24 2228 93     Resp 06/09/24 2228 19     Temp 06/09/24 2228 98.7 F (37.1 C)     Temp Source 06/09/24 2228 Oral     SpO2 06/09/24 2228 100 %     Weight  06/09/24 2227 140 lb (63.5 kg)     Height 06/09/24 2227 5' 3 (1.6 m)     Head Circumference --      Peak Flow --      Pain Score 06/09/24 2227 0     Pain Loc --      Pain Education --      Exclude from Growth Chart --     Most recent vital signs: Vitals:   06/09/24 2228 06/10/24 0134  BP: (!) 152/60 (!) 161/79  Pulse: 93 83  Resp: 19 18  Temp: 98.7 F (37.1 C) 98 F (36.7 C)  SpO2: 100% 98%     General: Awake, no distress.  CV:  Good peripheral perfusion. RRR, RP 2+ Resp:  Normal effort. CTAB Abd:  No distention. Nontender to deep palpation throughout Neuro:  Aox4, CN II-XII intact, FNF wnl, finger taps fast b/l, 5/5 strength in bilateral finger extension/grip, arm flexion/extension, EHL/FHL. BUE AG 10+ sec no drift, BLE AG 5+ sec no drift. Ambulates with steady gait. SILT. Negative Rhomberg.    ED Results / Procedures / Treatments   Labs (all labs ordered are listed, but only abnormal results are displayed) Labs Reviewed  BASIC METABOLIC PANEL WITH GFR - Abnormal; Notable for the following  components:      Result Value   Sodium 133 (*)    CO2 17 (*)    Glucose, Bld 116 (*)    Creatinine, Ser 1.10 (*)    GFR, Estimated 53 (*)    All other components within normal limits  CBC - Abnormal; Notable for the following components:   RBC 3.60 (*)    Hemoglobin 10.5 (*)    HCT 30.6 (*)    All other components within normal limits  TROPONIN I (HIGH SENSITIVITY)  TROPONIN I (HIGH SENSITIVITY)     EKG  See ED course below   RADIOLOGY Radiology interpreted by myself and radiology report reviewed.  No acute pathology identified.    PROCEDURES:  Critical Care performed: No  Procedures   MEDICATIONS ORDERED IN ED: Medications - No data to display   IMPRESSION / MDM / ASSESSMENT AND PLAN / ED COURSE  I reviewed the triage vital signs and the nursing notes.                              DDX/MDM/AP: Differential diagnosis includes, but is not limited to,  transient arrhythmia, consider underlying electrolyte abnormality or anemia, do not clinically suspect ACS.  With regard to intermittent dizziness, consider related to likely recent sinus infection, already being appropriately treated.  Nonfocal neurologic exam here, ambulates with steady gait, do not suspect underlying CVA.  No urinary symptoms, doubt UTI, offered screening UA which patient declined.  Plan: - Labs - EKG - Chest x-ray - Reassess  Patient's presentation is most consistent with acute presentation with potential threat to life or bodily function.  The patient is on the cardiac monitor to evaluate for evidence of arrhythmia and/or significant heart rate changes.  ED course below.  Work up unremarkable.  Evidence of possible mild dehydration, offered IV fluids versus oral hydration, patient prefers oral hydration.  Overall suspect possible transient arrhythmia though no evidence on EKG nor on cardiac monitoring here and fortunately no recurrence of symptoms for several hours.  Patient is clinically stable with no evidence of acute underlying pathology at this time, do not clinically suspect ACS and serial troponins are negative.  This is closely established with cardiology as an outpatient--counseled to call her cardiology clinic today to arrange close outpatient follow-up, can consider outpatient heart monitoring as needed, defer to cardiology service.  With regard to intermittent dizziness, no evidence of acute pathology at this time, offered meclizine though in discussion of risks and benefits prefers to defer at this time which I believe is very reasonable, we will continue treatment with amoxicillin and follow-up with her primary care provider.  Strict ED return precautions in place.  Patient agrees with plan.  Clinical Course as of 06/10/24 0240  Mon Jun 10, 2024  0213 CBC with no leukocytosis, stable anemia [MM]  0213 BMP reviewed, essentially at baseline, mild hyponatremia, very  mild bump in creatinine, mildly low bicarb [MM]  0214 Serial troponin stable [MM]  0214 Chest x-ray interpreted myself radiology reviewed.  No acute pathology identified. [MM]  0214 Ecg = sinus rhythm, rate 89, no gross ST elevation or depression, no significant repolarization abnormality, normal axis, normal intervals.  No evidence of ischemia nor arrhythmia my interpretation. [MM]    Clinical Course User Index [MM] Clarine Ozell LABOR, MD     FINAL CLINICAL IMPRESSION(S) / ED DIAGNOSES   Final diagnoses:  Palpitations     Rx /  DC Orders   ED Discharge Orders     None        Note:  This document was prepared using Dragon voice recognition software and may include unintentional dictation errors.   Clarine Ozell LABOR, MD 06/10/24 [phone]

## 2024-06-10 NOTE — Discharge Instructions (Addendum)
 Your evaluation in the emergency department was overall reassuring.  I am unsure as to the exact cause of your palpitations, though it is possible you may be going in and out of an abnormal heart rhythm.  We saw no evidence of this here today however, and I recommend you call your cardiologist later today to arrange reevaluation.  You should also follow-up with your primary care provider in addition.  As discussed, there was evidence of possible mild dehydration, and I recommend drinking plenty of water  daily.  Continue your amoxicillin as already prescribed, and return to the emergency department with any new or worsening symptoms.

## 2024-06-23 ENCOUNTER — Emergency Department
Admission: EM | Admit: 2024-06-23 | Discharge: 2024-06-24 | Disposition: A | Discharge status: Home / Self Care | Attending: Emergency Medicine | Admitting: Emergency Medicine

## 2024-06-23 ENCOUNTER — Other Ambulatory Visit: Payer: Self-pay

## 2024-06-23 ENCOUNTER — Emergency Department

## 2024-06-23 DIAGNOSIS — R002 Palpitations: Secondary | ICD-10-CM | POA: Insufficient documentation

## 2024-06-23 DIAGNOSIS — R42 Dizziness and giddiness: Secondary | ICD-10-CM | POA: Insufficient documentation

## 2024-06-23 LAB — CBC
HCT: 34.4 % — ABNORMAL LOW (ref 36.0–46.0)
Hemoglobin: 11.5 g/dL — ABNORMAL LOW (ref 12.0–15.0)
MCH: 29.2 pg (ref 26.0–34.0)
MCHC: 33.4 g/dL (ref 30.0–36.0)
MCV: 87.3 fL (ref 80.0–100.0)
Platelets: 243 K/uL (ref 150–400)
RBC: 3.94 MIL/uL (ref 3.87–5.11)
RDW: 13 % (ref 11.5–15.5)
WBC: 9 K/uL (ref 4.0–10.5)
nRBC: 0 % (ref 0.0–0.2)

## 2024-06-23 LAB — BASIC METABOLIC PANEL WITH GFR
Anion gap: 15 (ref 5–15)
BUN: 9 mg/dL (ref 8–23)
CO2: 13 mmol/L — ABNORMAL LOW (ref 22–32)
Calcium: 9.6 mg/dL (ref 8.9–10.3)
Chloride: 101 mmol/L (ref 98–111)
Creatinine, Ser: 0.81 mg/dL (ref 0.44–1.00)
GFR, Estimated: >60 mL/min (ref >60)
Glucose, Bld: 120 mg/dL — ABNORMAL HIGH (ref 70–99)
Potassium: 3.8 mmol/L (ref 3.5–5.1)
Sodium: 129 mmol/L — ABNORMAL LOW (ref 135–145)

## 2024-06-23 LAB — TROPONIN I (HIGH SENSITIVITY): Troponin I (High Sensitivity): 6 ng/L (ref <18)

## 2024-06-23 NOTE — ED Notes (Signed)
 Full rainbow sent down with red, SST, blue.

## 2024-06-23 NOTE — ED Triage Notes (Addendum)
 Pt to ed from home via POV for palpitations, dizziness and something in my stomach that has been going on intermittently all day today. Pt was seen for similar on 8/17. Pt is caox4, in no acute distress in triage. Pt was advised to follow up with her cardiologist and she has not done so yet.

## 2024-06-23 NOTE — ED Provider Notes (Signed)
 [facility] Provider Note    Event Date/Time   First MD Initiated Contact with Patient 06/23/24 2255     (approximate)   History   Palpitations and Dizziness   HPI Miranda LEHAN is a 73 y.o. female who presents for evaluation of dizziness, feeling like she is going to pass out, and a fluttering feeling in her upper abdomen.  She said it feels different than when she has had heart palpitations in the past.  She is feeling anxious because she is afraid that she is going to pass out and she is nervous about what may be wrong.  She was seen here about 2 weeks ago for fluttering of the heart and had a reassuring workup and was recommended to follow-up with cardiology.  She had a artificial aortic valve implanted at [facility] about 2 months ago, but has not yet had the opportunity to follow-up from her recent ED visit.  Of note, she has been treated recently for sinusitis and just finished her antibiotics yesterday.  She said that the symptoms have been present throughout the day today, and when the fluttering in her upper abdomen (not my heart) occurs, she feels dizzy as if she is spinning and feels like she is going to pass out.  She has no numbness or weakness in her extremities and is having no difficulty speaking or swallowing.  No word finding issues and no obvious confusion.  No fever, neck pain, chest pain, shortness of breath, nausea, vomiting, nor abdominal pain.  She said the best way she could describe it is fluttering.     Physical Exam   Triage Vital Signs: ED Triage Vitals [06/23/24 2136]  Encounter Vitals Group     BP (!) 173/80     Girls Systolic BP Percentile      Girls Diastolic BP Percentile      Boys Systolic BP Percentile      Boys Diastolic BP Percentile      Pulse Rate 98     Resp 18     Temp 98.8 F (37.1 C)     Temp Source Oral     SpO2 100 %     Weight      Height 1.6 m (5' 3)     Head Circumference      Peak Flow      Pain  Score 0     Pain Loc      Pain Education      Exclude from Growth Chart     Most recent vital signs: Vitals:   06/23/24 2234 06/23/24 2330  BP: (!) 169/83 (!) 165/88  Pulse: 83 80  Resp: 16 18  Temp:    SpO2: 100% 100%    General: Awake, no obvious distress but clearly anxious. CV:  Good peripheral perfusion.  Regular rate and rhythm, no PVCs on monitor or EKG. Resp:  Normal effort. Speaking easily and comfortably, no accessory muscle usage nor intercostal retractions.   Abd:  No distention.  No tenderness to palpation of the abdomen including the epigastrium and right upper quadrant.  No palpable mass and no visible pulsatile abdominal mass. Other:  Grossly intact neurologically, no obvious cranial nerve deficits, good major muscle group strength in arms and legs, no dysarthria nor aphasia.   ED Results / Procedures / Treatments   Labs (all labs ordered are listed, but only abnormal results are displayed) Labs Reviewed  BASIC METABOLIC PANEL WITH GFR - Abnormal; Notable for  the following components:      Result Value   Sodium 129 (*)    CO2 13 (*)    Glucose, Bld 120 (*)    All other components within normal limits  CBC - Abnormal; Notable for the following components:   Hemoglobin 11.5 (*)    HCT 34.4 (*)    All other components within normal limits  TROPONIN I (HIGH SENSITIVITY)  TROPONIN I (HIGH SENSITIVITY)     EKG  ED ECG REPORT I, Darleene Dome, the attending physician, personally viewed and interpreted this ECG.  Date: 06/23/2024 EKG Time: 21: 39 Rate: 99 Rhythm: normal sinus rhythm QRS Axis: normal Intervals: normal ST/T Wave abnormalities: Non-specific ST segment / T-wave changes, but no clear evidence of acute ischemia. Narrative Interpretation: no definitive evidence of acute ischemia; does not meet STEMI criteria.    RADIOLOGY I independently viewed and interpreted the patient's two-view chest x-ray and I see no evidence of pneumonia or widened  mediastinum.  I also read the radiologist's report, which confirmed no acute findings.  I also independently viewed and interpreted the patient's head CT.  There is no evidence of acute intracranial hemorrhage or tumor.  I also read the radiologist's report, which confirmed no acute findings.    PROCEDURES:  Critical Care performed: No  Procedures    IMPRESSION / MDM / ASSESSMENT AND PLAN / ED COURSE  I reviewed the triage vital signs and the nursing notes.                              Differential diagnosis includes, but is not limited to, benign vertigo (possibly related to sinusitis), central vertigo (CVA), transient cardiac arrhythmia, less likely ACS.  Patient's presentation is most consistent with acute presentation with potential threat to life or bodily function.  Labs/studies ordered: High-sensitivity troponin x 2, BMP, CBC, two-view chest x-ray, CT head, MR brain  Interventions/Medications given:  Medications  sodium chloride  0.9 % bolus 500 mL (500 mLs Intravenous New Bag/Given 06/24/24 0014)  meclizine  (ANTIVERT ) tablet 12.5 mg (12.5 mg Oral Given 06/24/24 0009)    (Note:  hospital course my include additional interventions and/or labs/studies not listed above.)   Vital signs are normal other than hypertension.  She is quite anxious but said this is related to her symptoms.  We had an extensive discussion about her relatively reassuring lab results, normal EKG, normal head CT, and normal two-view chest x-ray.  She is neurologically intact with no clear deficits.  Given the presence of the vertigo/dizziness which apparently has been present for 2 to 3 weeks but is worse now, we decided to proceed with MR brain to rule out CVA.  Her daughter mentioned that the patient has been on Seroquel for the last 2 to 3 weeks as well and we talked about the possibility of medication side effects as causing her symptoms, but she feels it is unlikely.  I think it will be beneficial for  her to provide the reassurance of a negative MR brain.  I will also treat with a small dose of meclizine  12.5 mg to see if this helps with the vertigo and she is getting 500 L of normal saline IV bolus to see if that also helps with her feelings of near syncope.     Clinical Course as of 06/24/24 0201  Mon Jun 24, 2024  0150 MR BRAIN WO CONTRAST I independently viewed and interpreted the patient's MR  brain and there is no evidence of CVA.  Confirmed by radiologist [CF]  0159 Reassessed patient.  She said she still feels dizzy but better than before.  We went over the reassuring results of her imaging and she and her family are comfortable with the plan for outpatient follow-up.  I considered hospitalization, but there is no clear indication to do so, and the patient does not want to stay in the hospital if she does not have to do so.  I think following up as an outpatient is very reasonable.  We talked about medication side effects and how she can use meclizine  but to be judicious with his use.  I gave my usual and customary return precautions. [CF]    Clinical Course User Index [CF] Gordan Huxley, MD     FINAL CLINICAL IMPRESSION(S) / ED DIAGNOSES   Final diagnoses:  Dizziness  Palpitations     Rx / DC Orders   ED Discharge Orders          Ordered    meclizine  (ANTIVERT ) 12.5 MG tablet  2 times daily PRN        06/24/24 0201             Note:  This document was prepared using Dragon voice recognition software and may include unintentional dictation errors.   Gordan Huxley, MD 06/24/24 [phone]

## 2024-06-24 ENCOUNTER — Emergency Department

## 2024-06-24 DIAGNOSIS — R42 Dizziness and giddiness: Secondary | ICD-10-CM | POA: Diagnosis not present

## 2024-06-24 LAB — TROPONIN I (HIGH SENSITIVITY): Troponin I (High Sensitivity): 6 ng/L (ref <18)

## 2024-06-24 MED ORDER — MECLIZINE HCL 25 MG PO TABS
12.5000 mg | ORAL_TABLET | Freq: Once | ORAL | Status: AC
  Administered 2024-06-24: 12.5 mg via ORAL
  Filled 2024-06-24: qty 1

## 2024-06-24 MED ORDER — SODIUM CHLORIDE 0.9 % IV BOLUS
500.0000 mL | Freq: Once | INTRAVENOUS | Status: AC
  Administered 2024-06-24: 500 mL via INTRAVENOUS

## 2024-06-24 MED ORDER — MECLIZINE HCL 12.5 MG PO TABS: 12.5000 mg | ORAL_TABLET | Freq: Two times a day (BID) | ORAL | 0 refills | Status: AC | PRN

## 2024-06-24 NOTE — Discharge Instructions (Signed)
We believe your symptoms were caused by benign vertigo.  Please read through the included information and take any prescribed medication(s).  Follow up with your doctor as listed above.  If you develop any new or worsening symptoms that concern you, including but not limited to persistent dizziness/vertigo, numbness or weakness in your arms or legs, altered mental status, persistent vomiting, or fever greater than 101, please return immediately to the Emergency Department.
